# Patient Record
Sex: Female | Born: 1975
Health system: Southern US, Community
[De-identification: ages and names within clinical notes are randomized; demographics above are authoritative.]

## PROBLEM LIST (undated history)

## (undated) DIAGNOSIS — E079 Disorder of thyroid, unspecified: Secondary | ICD-10-CM

## (undated) DIAGNOSIS — R87619 Unspecified abnormal cytological findings in specimens from cervix uteri: Secondary | ICD-10-CM

## (undated) DIAGNOSIS — D649 Anemia, unspecified: Secondary | ICD-10-CM

## (undated) HISTORY — DX: Disorder of thyroid, unspecified: E07.9

## (undated) HISTORY — PX: TONSILLECTOMY: SUR1361

## (undated) HISTORY — DX: Anemia, unspecified: D64.9

## (undated) HISTORY — DX: Unspecified abnormal cytological findings in specimens from cervix uteri: R87.619

---

## 2015-05-13 ENCOUNTER — Other Ambulatory Visit: Payer: 59 | Admitting: Internal Medicine

## 2015-05-13 DIAGNOSIS — Z130 Encounter for screening for diseases of the blood and blood-forming organs and certain disorders involving the immune mechanism: Secondary | ICD-10-CM

## 2015-05-13 DIAGNOSIS — Z13220 Encounter for screening for lipoid disorders: Secondary | ICD-10-CM

## 2015-05-13 DIAGNOSIS — Z1329 Encounter for screening for other suspected endocrine disorder: Secondary | ICD-10-CM

## 2015-05-13 DIAGNOSIS — Z0000 Encounter for general adult medical examination without abnormal findings: Secondary | ICD-10-CM

## 2015-05-13 DIAGNOSIS — Z1321 Encounter for screening for nutritional disorder: Secondary | ICD-10-CM

## 2015-05-13 LAB — CBC WITH DIFFERENTIAL/PLATELET
BASOS PCT: 1 % (ref 0–1)
Basophils Absolute: 0.1 10*3/uL (ref 0.0–0.1)
EOS ABS: 0.1 10*3/uL (ref 0.0–0.7)
Eosinophils Relative: 2 % (ref 0–5)
HCT: 40.5 % (ref 36.0–46.0)
Hemoglobin: 13.4 g/dL (ref 12.0–15.0)
Lymphocytes Relative: 37 % (ref 12–46)
Lymphs Abs: 2 10*3/uL (ref 0.7–4.0)
MCH: 29.1 pg (ref 26.0–34.0)
MCHC: 33.1 g/dL (ref 30.0–36.0)
MCV: 88 fL (ref 78.0–100.0)
MONO ABS: 0.4 10*3/uL (ref 0.1–1.0)
MPV: 10.6 fL (ref 8.6–12.4)
Monocytes Relative: 8 % (ref 3–12)
NEUTROS PCT: 52 % (ref 43–77)
Neutro Abs: 2.8 10*3/uL (ref 1.7–7.7)
PLATELETS: 214 10*3/uL (ref 150–400)
RBC: 4.6 MIL/uL (ref 3.87–5.11)
RDW: 13.3 % (ref 11.5–15.5)
WBC: 5.4 10*3/uL (ref 4.0–10.5)

## 2015-05-13 LAB — LIPID PANEL
CHOL/HDL RATIO: 3 ratio (ref <=5.0)
Cholesterol: 136 mg/dL (ref 125–200)
HDL: 46 mg/dL (ref >=46)
LDL CALC: 77 mg/dL (ref <130)
Triglycerides: 63 mg/dL (ref <150)
VLDL: 13 mg/dL (ref <30)

## 2015-05-13 LAB — COMPLETE METABOLIC PANEL WITH GFR
ALT: 10 U/L (ref 6–29)
AST: 14 U/L (ref 10–30)
Albumin: 4.1 g/dL (ref 3.6–5.1)
Alkaline Phosphatase: 50 U/L (ref 33–115)
BUN: 13 mg/dL (ref 7–25)
CHLORIDE: 106 mmol/L (ref 98–110)
CO2: 26 mmol/L (ref 20–31)
Calcium: 8.9 mg/dL (ref 8.6–10.2)
Creat: 0.79 mg/dL (ref 0.50–1.10)
GFR, Est African American: >89 mL/min (ref >=60)
Glucose, Bld: 80 mg/dL (ref 65–99)
POTASSIUM: 4.1 mmol/L (ref 3.5–5.3)
SODIUM: 140 mmol/L (ref 135–146)
Total Bilirubin: 0.8 mg/dL (ref 0.2–1.2)
Total Protein: 7.2 g/dL (ref 6.1–8.1)

## 2015-05-13 LAB — TSH: TSH: 2.74 m[IU]/L

## 2015-05-14 LAB — VITAMIN D 25 HYDROXY (VIT D DEFICIENCY, FRACTURES): Vit D, 25-Hydroxy: 27 ng/mL — ABNORMAL LOW (ref 30–100)

## 2015-05-15 ENCOUNTER — Ambulatory Visit (INDEPENDENT_AMBULATORY_CARE_PROVIDER_SITE_OTHER): Payer: 59 | Admitting: Internal Medicine

## 2015-05-15 ENCOUNTER — Encounter: Payer: Self-pay | Admitting: Internal Medicine

## 2015-05-15 ENCOUNTER — Other Ambulatory Visit: Payer: Self-pay

## 2015-05-15 VITALS — BP 122/68 | HR 67 | Temp 97.6°F | Resp 20 | Ht 64.0 in | Wt 133.5 lb

## 2015-05-15 DIAGNOSIS — Z803 Family history of malignant neoplasm of breast: Secondary | ICD-10-CM

## 2015-05-15 DIAGNOSIS — E039 Hypothyroidism, unspecified: Secondary | ICD-10-CM | POA: Diagnosis not present

## 2015-05-15 DIAGNOSIS — Z0000 Encounter for general adult medical examination without abnormal findings: Secondary | ICD-10-CM | POA: Diagnosis not present

## 2015-05-15 DIAGNOSIS — Z1231 Encounter for screening mammogram for malignant neoplasm of breast: Secondary | ICD-10-CM

## 2015-05-15 DIAGNOSIS — E559 Vitamin D deficiency, unspecified: Secondary | ICD-10-CM

## 2015-05-15 DIAGNOSIS — Z1239 Encounter for other screening for malignant neoplasm of breast: Secondary | ICD-10-CM

## 2015-05-15 LAB — POCT URINALYSIS DIPSTICK
BILIRUBIN UA: NEGATIVE
GLUCOSE UA: NEGATIVE
Ketones, UA: NEGATIVE
Leukocytes, UA: NEGATIVE
Nitrite, UA: NEGATIVE
Protein, UA: NEGATIVE
RBC UA: NEGATIVE
SPEC GRAV UA: 1.01
UROBILINOGEN UA: 0.2
pH, UA: 6.5

## 2015-05-15 MED ORDER — LEVOTHYROXINE SODIUM 88 MCG PO TABS: 88.0000 ug | ORAL_TABLET | Freq: Every day | ORAL | Status: DC

## 2015-05-15 NOTE — Progress Notes (Signed)
Subjective:    Patient ID: Shelly AlimentSarah Fowler, female    DOB: 1976/01/04, 40 y.o.   MRN: [id number]  HPI First visit for this 40 year old White Female who moved to [place] from [place] in the Fall of 2016. She was referred by Frederik Peararoline Paris.  History of hypothyroidism since the age of 40. Doesn't recall having thyroiditis preceding development of hypothyroidism. Patient was a runner for many years. She ran competitively at [employer].   History of stress fractures in shins and one in femur from running. Currently not on vitamin D supplement. Vitamin D level is low and  have recommended vitamin D3 daily.  Past medical history:   No known drug allergies. No history of illnesses requiring hospitalization except for tonsillectomy in 1978.   3 pregnancies, one miscarriage  Only medication at the present time levothyroxin 0.088 mg daily.  Not sure date of last tetanus immunization but had numerous injections when she went to [place] in 2009.  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 Married with 2 daughters ages 106 and 2. Does not smoke. Social alcohol consumption. Has 1 or 2 alcoholic drinks 3-5 days a week. Husband is Librarian, academiccorporate executive with [employer] formerly [employer].  Family history: Mother with history of breast cancer and mental illness.  Father with history of hypertension and prostate cancer. One sister died from suicide with history of schizophrenia in age 41. One sister age 41 with no health issues. One sister age 41 with mood disorder. Brother age 41 with hypertension. Another brother age 830 with  depression. Brother age 41 health status unknown.  Last Pap smear 2015 was normal.  Had test for CHEK 2 mutation in [place] because of mother's history of breast cancer which was negative.  Last mammogram June 2016 in [place]     Review of Systems no issues with menstrual period. Patient scribes 15 pound weight gain since age 40. Has gained about  5 pounds in the last year. His been busy with moving and taking care of 2 small children. Not able to exercise as much as she once did.     Objective:   Physical Exam  Constitutional: She is oriented to person, place, and time. She appears well-developed and well-nourished. No distress.  HENT:  Head: Normocephalic and atraumatic.  Right Ear: External ear normal.  Left Ear: External ear normal.  Mouth/Throat: Oropharynx is clear and moist.  Eyes: Conjunctivae and EOM are normal. Pupils are equal, round, and reactive to light. Right eye exhibits no discharge. Left eye exhibits no discharge.  Neck: Neck supple. No JVD present. No thyromegaly present.  Cardiovascular: Normal rate, regular rhythm, normal heart sounds and intact distal pulses.   No murmur heard. Pulmonary/Chest: Effort normal and breath sounds normal. No respiratory distress. She has no wheezes.  Breasts normal female without masses. Order given for mammogram  Abdominal: Soft. Bowel sounds are normal. She exhibits no distension and no mass. There is no tenderness. There is no rebound and no guarding.  Genitourinary:  Pap not taken because pap done 2015. No masses on bimanual exam. Rectovaginal confirms.  Musculoskeletal: She exhibits no edema.  Lymphadenopathy:    She has no cervical adenopathy.  Neurological: She is alert and oriented to person, place, and time. She has normal reflexes. No cranial nerve deficit. Coordination normal.  Skin: Skin is warm and dry. No rash noted. She is not diaphoretic.  Psychiatric: She has a normal mood and affect. Her behavior is  normal. Judgment and thought content normal.  Vitals reviewed.         Assessment & Plan:  Normal health maintenance exam  Hypothyroidism-TSH is within normal limits on current dose of Synthroid. Refill for one year  Family history of breast cancer in mother-patient last had mammogram June 2016. Is to get mammogram in June. Order written. Suggested 3-D  mammogram.  Vitamin D deficiency-vitamin D level is 27  Plan: Refill thyroid replacement medication for one year. Usually just gets TSH checked on an annual basis.  Take 2000 units vitamin D 3 daily. Have mammogram in June. Order written. Continue same dose of thyroid replacement. Return in one year or as needed.

## 2015-05-15 NOTE — Patient Instructions (Addendum)
Take Vitamin D3 2000 units daily. Continue thyroid replacement. Have 3D mammogram.  RTC one year. It was a pleasure to see you today.

## 2015-06-03 ENCOUNTER — Ambulatory Visit
Admission: RE | Admit: 2015-06-03 | Discharge: 2015-06-03 | Disposition: A | Discharge status: Home / Self Care | Payer: 59 | Source: Ambulatory Visit

## 2015-06-03 DIAGNOSIS — Z1231 Encounter for screening mammogram for malignant neoplasm of breast: Secondary | ICD-10-CM

## 2015-06-06 ENCOUNTER — Telehealth: Payer: Self-pay

## 2015-06-06 ENCOUNTER — Other Ambulatory Visit: Payer: Self-pay | Admitting: Internal Medicine

## 2015-06-06 ENCOUNTER — Telehealth: Payer: Self-pay | Admitting: Internal Medicine

## 2015-06-06 DIAGNOSIS — Z029 Encounter for administrative examinations, unspecified: Secondary | ICD-10-CM

## 2015-06-06 DIAGNOSIS — R921 Mammographic calcification found on diagnostic imaging of breast: Secondary | ICD-10-CM

## 2015-06-06 MED ORDER — MEBENDAZOLE 100 MG PO CHEW: CHEWABLE_TABLET | ORAL | Status: DC

## 2015-06-06 NOTE — Telephone Encounter (Signed)
Husband and 41 year old has pinworms.  Wants to know if there's anything that she needs to do.  Neither has started medication.  She states that they are both to start medication later today.  She was advised to contact her doctor.  She saw them on the 41 year old.  They're not going to test.  Husband's doctor is going to provide medication.    Pharmacy:  [facility] [address]

## 2015-06-06 NOTE — Telephone Encounter (Signed)
Patient has been notified

## 2015-06-06 NOTE — Telephone Encounter (Signed)
Patient contacted office and states that the mebendazole is $735.00 for 2 tabs. She wants to know if we can call something different in. Her husband was given

## 2015-06-06 NOTE — Telephone Encounter (Signed)
Patient was notified and states that she has not asked the pharmacist yet how much the medication would be. She states that she has been to 6 different pharmacies with no luck finding anything over the counter. She was advise to contact us again by 5:00 if she wants to prescription and that she should talk to her pharmacist as well to help her find something over the counter. She called back and states that she found the reese's and will take that.

## 2015-06-06 NOTE — Telephone Encounter (Signed)
Albendazole 400 mg onece and repeat in 2 weeks for total of 2 tabs is another option. Please have her check this cost with her pharmacy before we prescribe. The other option is Reese's pinworm medication OTC

## 2015-06-06 NOTE — Telephone Encounter (Signed)
Call in Mebendazole 100 mg once and repeat in 2 weeks for  total of 2 tablets. Wash linens in hot water.

## 2015-06-18 ENCOUNTER — Ambulatory Visit
Admission: RE | Admit: 2015-06-18 | Discharge: 2015-06-18 | Disposition: A | Discharge status: Home / Self Care | Payer: 59 | Source: Ambulatory Visit | Attending: Internal Medicine | Admitting: Internal Medicine

## 2015-06-18 DIAGNOSIS — R921 Mammographic calcification found on diagnostic imaging of breast: Secondary | ICD-10-CM

## 2015-11-28 ENCOUNTER — Other Ambulatory Visit: Payer: Self-pay | Admitting: Internal Medicine

## 2016-03-03 ENCOUNTER — Other Ambulatory Visit: Payer: Self-pay | Admitting: Internal Medicine

## 2016-03-03 NOTE — Telephone Encounter (Signed)
Call and book PE after March 30 OK to refill until then after booking.

## 2016-03-08 ENCOUNTER — Ambulatory Visit (INDEPENDENT_AMBULATORY_CARE_PROVIDER_SITE_OTHER): Payer: 59 | Admitting: Internal Medicine

## 2016-03-08 ENCOUNTER — Encounter: Payer: Self-pay | Admitting: Internal Medicine

## 2016-03-08 VITALS — BP 110/80 | HR 55 | Wt 128.0 lb

## 2016-03-08 DIAGNOSIS — J029 Acute pharyngitis, unspecified: Secondary | ICD-10-CM | POA: Diagnosis not present

## 2016-03-08 DIAGNOSIS — J069 Acute upper respiratory infection, unspecified: Secondary | ICD-10-CM | POA: Diagnosis not present

## 2016-03-08 LAB — POCT RAPID STREP A (OFFICE): RAPID STREP A SCREEN: NEGATIVE

## 2016-03-08 MED ORDER — AMOXICILLIN 500 MG PO CAPS: 500.0000 mg | ORAL_CAPSULE | Freq: Three times a day (TID) | ORAL | 0 refills | Status: DC

## 2016-03-08 NOTE — Patient Instructions (Signed)
Rapid strep screen is negative. Take amoxicillin 500 mg 3 times a day for 10 days. May take DayQuil for nasal congestion and Delsym for cough.

## 2016-03-08 NOTE — Progress Notes (Signed)
   Subjective:    Patient ID: Shelly AlimentSarah Fowler, female    DOB: 1975/02/26, 41 y.o.   MRN: [id number]  HPI 41 year old Female who has a young daughter in preschool. She's been getting sick more often since child has been in daycare. Has come down with a respiratory infection. Had onset yesterday of scratchy throat. Has had some cough and some discolored nasal drainage. No fever or shaking chills. Does not feel like she has the flu. Husband has been sick as well. See above    Review of Systems     Objective:   Physical Exam Skin warm and dry. Nodes none. TMs are clear. Pharynx is slightly injected without exudate. Rapid strep screen is negative. Neck is supple. Chest clear to auscultation.       Assessment & Plan:  Acute URI  Acute pharyngitis  Plan: Amoxicillin 500 mg 3 times daily for 10 days. May take over-the-counter Delsym for cough. May take  DayQuil for nasal congestion.

## 2016-05-21 ENCOUNTER — Other Ambulatory Visit: Payer: Self-pay | Admitting: Internal Medicine

## 2016-05-24 ENCOUNTER — Other Ambulatory Visit: Payer: Self-pay | Admitting: Internal Medicine

## 2016-05-24 ENCOUNTER — Other Ambulatory Visit: Payer: 59 | Admitting: Internal Medicine

## 2016-05-24 DIAGNOSIS — E039 Hypothyroidism, unspecified: Secondary | ICD-10-CM

## 2016-05-24 DIAGNOSIS — E559 Vitamin D deficiency, unspecified: Secondary | ICD-10-CM

## 2016-05-24 DIAGNOSIS — Z13220 Encounter for screening for lipoid disorders: Secondary | ICD-10-CM

## 2016-05-24 DIAGNOSIS — Z0000 Encounter for general adult medical examination without abnormal findings: Secondary | ICD-10-CM

## 2016-05-24 LAB — COMPREHENSIVE METABOLIC PANEL
ALT: 11 U/L (ref 6–29)
AST: 19 U/L (ref 10–30)
Albumin: 4.3 g/dL (ref 3.6–5.1)
Alkaline Phosphatase: 53 U/L (ref 33–115)
BILIRUBIN TOTAL: 0.9 mg/dL (ref 0.2–1.2)
BUN: 20 mg/dL (ref 7–25)
CHLORIDE: 105 mmol/L (ref 98–110)
CO2: 28 mmol/L (ref 20–31)
CREATININE: 0.91 mg/dL (ref 0.50–1.10)
Calcium: 8.8 mg/dL (ref 8.6–10.2)
Glucose, Bld: 86 mg/dL (ref 65–99)
Potassium: 4.3 mmol/L (ref 3.5–5.3)
SODIUM: 139 mmol/L (ref 135–146)
TOTAL PROTEIN: 6.8 g/dL (ref 6.1–8.1)

## 2016-05-24 LAB — CBC WITH DIFFERENTIAL/PLATELET
BASOS ABS: 46 {cells}/uL (ref 0–200)
Basophils Relative: 1 %
Eosinophils Absolute: 138 cells/uL (ref 15–500)
Eosinophils Relative: 3 %
HCT: 40.3 % (ref 35.0–45.0)
Hemoglobin: 13.3 g/dL (ref 11.7–15.5)
LYMPHS ABS: 1656 {cells}/uL (ref 850–3900)
Lymphocytes Relative: 36 %
MCH: 29.4 pg (ref 27.0–33.0)
MCHC: 33 g/dL (ref 32.0–36.0)
MCV: 89.2 fL (ref 80.0–100.0)
MONOS PCT: 7 %
MPV: 10.2 fL (ref 7.5–12.5)
Monocytes Absolute: 322 cells/uL (ref 200–950)
NEUTROS ABS: 2438 {cells}/uL (ref 1500–7800)
Neutrophils Relative %: 53 %
PLATELETS: 192 10*3/uL (ref 140–400)
RBC: 4.52 MIL/uL (ref 3.80–5.10)
RDW: 13.1 % (ref 11.0–15.0)
WBC: 4.6 10*3/uL (ref 3.8–10.8)

## 2016-05-24 LAB — LIPID PANEL
CHOLESTEROL: 136 mg/dL (ref <200)
HDL: 54 mg/dL (ref >50)
LDL CALC: 73 mg/dL (ref <100)
Total CHOL/HDL Ratio: 2.5 Ratio (ref <5.0)
Triglycerides: 44 mg/dL (ref <150)
VLDL: 9 mg/dL (ref <30)

## 2016-05-24 LAB — TSH: TSH: 2.99 mIU/L

## 2016-05-25 ENCOUNTER — Other Ambulatory Visit: Payer: Self-pay | Admitting: Internal Medicine

## 2016-05-25 ENCOUNTER — Encounter: Payer: Self-pay | Admitting: Internal Medicine

## 2016-05-25 ENCOUNTER — Ambulatory Visit (INDEPENDENT_AMBULATORY_CARE_PROVIDER_SITE_OTHER): Payer: 59 | Admitting: Internal Medicine

## 2016-05-25 VITALS — BP 102/78 | HR 64 | Ht 63.0 in | Wt 128.0 lb

## 2016-05-25 DIAGNOSIS — Z803 Family history of malignant neoplasm of breast: Secondary | ICD-10-CM

## 2016-05-25 DIAGNOSIS — Z0000 Encounter for general adult medical examination without abnormal findings: Secondary | ICD-10-CM

## 2016-05-25 DIAGNOSIS — N921 Excessive and frequent menstruation with irregular cycle: Secondary | ICD-10-CM

## 2016-05-25 DIAGNOSIS — E559 Vitamin D deficiency, unspecified: Secondary | ICD-10-CM | POA: Diagnosis not present

## 2016-05-25 DIAGNOSIS — E039 Hypothyroidism, unspecified: Secondary | ICD-10-CM

## 2016-05-25 DIAGNOSIS — Z1231 Encounter for screening mammogram for malignant neoplasm of breast: Secondary | ICD-10-CM

## 2016-05-25 LAB — TSH: TSH: 3.01 m[IU]/L

## 2016-05-25 LAB — POCT URINALYSIS DIPSTICK
Bilirubin, UA: NEGATIVE
GLUCOSE UA: NEGATIVE
Ketones, UA: NEGATIVE
Leukocytes, UA: NEGATIVE
Nitrite, UA: NEGATIVE
PH UA: 7 (ref 5.0–8.0)
PROTEIN UA: NEGATIVE
Spec Grav, UA: 1.01 (ref 1.030–1.035)
UROBILINOGEN UA: 0.2 (ref ?–2.0)

## 2016-05-25 LAB — VITAMIN D 25 HYDROXY (VIT D DEFICIENCY, FRACTURES): VIT D 25 HYDROXY: 25 ng/mL — AB (ref 30–100)

## 2016-05-25 MED ORDER — LEVOTHYROXINE SODIUM 88 MCG PO TABS: 88.0000 ug | ORAL_TABLET | Freq: Every day | ORAL | 1 refills | Status: DC

## 2016-05-25 NOTE — Progress Notes (Signed)
Subjective:    Patient ID: Shelly Fowler, female    DOB: December 02, 1975, 41 y.o.   MRN: [id number]  HPI 41 year old female for health maintenance exam and evaluation of medical issues.Initially presented here for the first time in March 2017. She was referred by Frederik Pear.  History of hypothyroidism since the age of 61. Did not recall having thyroiditis preceding development of hypothyroidism. Patient was a runner for many years. She ran competitively at [employer].  History of stress fractures and she ends and one in femur from running.  Past medical history: No known drug allergies. No history of illnesses requiring hospitalization except for tonsillectomy in 1978. 3 pregnancies and one miscarriage.  She is on thyroid replacement levothyroxine 0.088 mg daily.  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 She is married with 2 daughters ages 18 and 60. Does not smoke. Social alcohol consumption. She has one or 2 alcoholic drinks 3-5 days a week. Husband is a Librarian, academic with [employer] formerly known as [employer].  Family history: Mother with history of breast cancer and mental illness. Father with history of hypertension and prostate cancer. One sister died from suicide with history of schizophrenia at age 52. One sister in her mid 45s with no health issues. One sister age 7 with mood disorder. Brother age 50 with hypertension. Another brother in his early 62s with history of depression. One brother age 11 his health status is unknown.  Patient had test for CHEK 2 mutation in [place] because of mother's history of breast cancer which was negative.      Review of Systems  Constitutional: Negative.   All other systems reviewed and are negative.      Objective:   Physical Exam  Constitutional: She is oriented to person, place, and time. She appears well-developed and well-nourished. No distress.  HENT:  Head: Normocephalic and atraumatic.    Right Ear: External ear normal.  Left Ear: External ear normal.  Mouth/Throat: Oropharynx is clear and moist. No oropharyngeal exudate.  Eyes: Conjunctivae and EOM are normal. Pupils are equal, round, and reactive to light. Left eye exhibits no discharge. No scleral icterus.  Neck: Neck supple. No JVD present. No thyromegaly present.  Cardiovascular: Normal rate, regular rhythm, normal heart sounds and intact distal pulses.   No murmur heard. Pulmonary/Chest: Effort normal and breath sounds normal. No respiratory distress. She has no wheezes. She has no rales.  Breasts normal female  Abdominal: Soft. Bowel sounds are normal. She exhibits no distension and no mass. There is no tenderness. There is no rebound and no guarding.  Genitourinary:  Genitourinary Comments: Pap deferred. Last Pap was in [place] in [place]. Bimanual normal.  Musculoskeletal: Normal range of motion. She exhibits no edema or deformity.  Lymphadenopathy:    She has no cervical adenopathy.  Neurological: She is alert and oriented to person, place, and time. She has normal reflexes. She displays normal reflexes. No cranial nerve deficit. Coordination normal.  Skin: Skin is warm and dry. No rash noted. She is not diaphoretic.  Psychiatric: She has a normal mood and affect. Her behavior is normal. Thought content normal.  Vitals reviewed.         Assessment & Plan:  Normal health maintenance exam  Vitamin D deficiency-recommend 2000 units vitamin D 3 daily  Hypothyroidism-TSH is within normal limits on current dose of thyroid replacement. Refill for one year.  Family history of breast cancer in mother. Suggest annual  mammogram.  Plan: See above and return one year or as needed.

## 2016-06-09 NOTE — Patient Instructions (Addendum)
FSH checked because of history of irregular menses. Take 2000 units vitamin D 3 daily. Have annual mammogram and return in one year. Continue same dose of thyroid replacement. Patient return on April 26 for Pap smear and TDap Vaccine.

## 2016-06-10 ENCOUNTER — Ambulatory Visit (INDEPENDENT_AMBULATORY_CARE_PROVIDER_SITE_OTHER): Payer: 59 | Admitting: Internal Medicine

## 2016-06-10 ENCOUNTER — Other Ambulatory Visit (HOSPITAL_COMMUNITY)
Admission: RE | Admit: 2016-06-10 | Discharge: 2016-06-10 | Disposition: A | Discharge status: Home / Self Care | Payer: 59 | Source: Ambulatory Visit | Attending: Internal Medicine | Admitting: Internal Medicine

## 2016-06-10 ENCOUNTER — Encounter: Payer: Self-pay | Admitting: Internal Medicine

## 2016-06-10 VITALS — BP 110/82 | HR 49 | Temp 98.6°F | Wt 128.0 lb

## 2016-06-10 DIAGNOSIS — N921 Excessive and frequent menstruation with irregular cycle: Secondary | ICD-10-CM

## 2016-06-10 DIAGNOSIS — Z23 Encounter for immunization: Secondary | ICD-10-CM

## 2016-06-10 DIAGNOSIS — Z01419 Encounter for gynecological examination (general) (routine) without abnormal findings: Secondary | ICD-10-CM | POA: Insufficient documentation

## 2016-06-10 DIAGNOSIS — Z124 Encounter for screening for malignant neoplasm of cervix: Secondary | ICD-10-CM | POA: Diagnosis not present

## 2016-06-10 NOTE — Patient Instructions (Signed)
Pap smear completed. FSH drawn. Tetanus immunization update given.

## 2016-06-10 NOTE — Progress Notes (Signed)
   Subjective:    Patient ID: Shelly Fowler, female    DOB: 02-Feb-1976, 41 y.o.   MRN: [id number]  HPI She is here today to have Tdap immunization, Pap smear and pelvic exam which was not able be done at last visit and have FSH drawn because she's been having irregular menses. She's been having menstrual periods about every other month for the last several months.    Review of Systems     Objective:   Physical Exam : Normal female external genitalia Pap taken. Cervical and endocervical swabs obtained. No masses on bimanual exam. Rectovaginal confirms.       Assessment & Plan:  Need for Tdap immunization  Metrorrhagia  FSH drawn and pending. Pap smear completed.

## 2016-06-11 LAB — FOLLICLE STIMULATING HORMONE: FSH: 24.7 m[IU]/mL

## 2016-06-15 LAB — CYTOLOGY - PAP
Diagnosis: NEGATIVE
HPV: NOT DETECTED

## 2016-06-21 ENCOUNTER — Ambulatory Visit (INDEPENDENT_AMBULATORY_CARE_PROVIDER_SITE_OTHER): Payer: 59 | Admitting: Obstetrics & Gynecology

## 2016-06-21 ENCOUNTER — Encounter: Payer: Self-pay | Admitting: Obstetrics & Gynecology

## 2016-06-21 VITALS — BP 108/70 | HR 56 | Resp 14 | Ht 64.0 in | Wt 128.0 lb

## 2016-06-21 DIAGNOSIS — N839 Noninflammatory disorder of ovary, fallopian tube and broad ligament, unspecified: Secondary | ICD-10-CM | POA: Diagnosis not present

## 2016-06-21 DIAGNOSIS — E349 Endocrine disorder, unspecified: Secondary | ICD-10-CM

## 2016-06-21 DIAGNOSIS — E559 Vitamin D deficiency, unspecified: Secondary | ICD-10-CM

## 2016-06-21 DIAGNOSIS — N838 Other noninflammatory disorders of ovary, fallopian tube and broad ligament: Secondary | ICD-10-CM

## 2016-06-21 MED ORDER — VITAMIN D (ERGOCALCIFEROL) 1.25 MG (50000 UNIT) PO CAPS: 50000.0000 [IU] | ORAL_CAPSULE | ORAL | 1 refills | Status: DC

## 2016-06-21 NOTE — Progress Notes (Signed)
Patient scheduled while in office. Sp[facility] with Cher[facility] at [facility], patient scheduled for BMD on 06/22/16 at 8:30am. Patient scheduled for 3 month Vit D lab on 09/27/16 at 9:45am. Patient is agreeable to dates and times.

## 2016-06-21 NOTE — Progress Notes (Addendum)
41 y.o. [id number] MarriedCaucasianF here for new patient appt.  She was referred from Dr. Lenord FellersBaxley for perimenopausal symptoms.  FSH was 24.  She has been having less regular cycles for about the year.  She can skip for several months but has not gone more than three months without a cycle.  Flow is not heavy and lasts 4-5 days.    Mother has hx of breast cancer.  She did have an positive genetic testing.  Pt is not completely sure the name of the gene, however, pt did proceed with genetic testing and this was negative.  Mother now has stage IV breast cancer.  Pt has experienced several stress fractures in her legs due to distant running.  These was when she was younger.  Pt reports she only recently started exercising and running more regularly.  States she didn't exercise that much during the years of her pregnancies and, again, until recently.  Due to her reading on the Internet, she does have some concern about bone health.    Also wants to talk about possible treatments, alternative therapies and other risks she should be aware of at this time.  Patient's last menstrual period was 05/25/2016.          Sexually active: no The current method of family planning is abstinence.    Exercising: Yes.    running Smoker:  no  Health Maintenance: Pap:  06/10/16 History of abnormal Pap:  Yes, 2008 but follow up was negative MMG:  06/18/15, has MMG scheduled tomorrow Colonoscopy:  n/a BMD:   Never done TDaP:  06/10/16 Screening Labs: with Dr. Lenord FellersBaxley   reports that she has never smoked. She has never used smokeless tobacco. She reports that she drinks about 3.6 oz of alcohol per week . She reports that she does not use drugs.  Past Medical History:  Diagnosis Date  . Abnormal Pap smear of cervix ~2008   f/u normal   . Anemia   . Thyroid disease     Past Surgical History:  Procedure Laterality Date  . TONSILLECTOMY     age 28    Current Outpatient Prescriptions  Medication Sig Dispense Refill   . cholecalciferol (VITAMIN D) 1000 units tablet Take 1,000 Units by mouth daily.    Marland Kitchen. levothyroxine (SYNTHROID, LEVOTHROID) 88 MCG tablet Take 1 tablet (88 mcg total) by mouth daily. 90 tablet 1   No current facility-administered medications for this visit.     Family History  Problem Relation Age of Onset  . Breast cancer Mother   . Diabetes Mother   . Cancer Father     Prostate  . Diabetes Paternal Grandmother     ROS:  Pertinent items are noted in HPI.  Otherwise, a comprehensive ROS was negative.  Exam:   BP 108/70 (BP Location: Right Arm, Patient Position: Sitting, Cuff Size: Normal)   Pulse (!) 56   Resp 14   Ht 5\' 4"  (1.626 m)   Wt 128 lb (58.1 kg)   LMP 05/25/2016 Comment: patient is currently menstruating   BMI 21.97 kg/m     Height: 5\' 4"  (162.6 cm)  Ht Readings from Last 3 Encounters:  06/21/16 5\' 4"  (1.626 m)  05/25/16 5\' 3"  (1.6 m)  05/15/15 5\' 4"  (1.626 m)   Physical Exam  Constitutional: She is oriented to person, place, and time. She appears well-developed and well-nourished.  Neurological: She is alert and oriented to person, place, and time.  Psychiatric: She has a normal mood and  affect.  No other physical exam performed  Discussion:  FSH and timing of menopause reviewed.  FSH fluctuations as well as confirmation with [facility] discussed.  Pt does not feel she needs this.  Pt does not meet criteria for premature ovarian failure so extensive testing for early menopause is not needed.  She has had recent TSH testing.  Menopausal risks for cardiovascular disease and bone health reviewed.  Treatment with OCPs or HRT discussed.  With family hx, she is not interested in this and I agree.  Feel baseline BMD would be useful in determining whether Evista might be helpful for decreasing breast cancer risk and helping with bone health.  Pt would be open to this.  Optimal calcium and Vit D discussed.  Recent Vit D has been obtained.  Pt voices clear understanding  A:   Perimenopausal with FSH of 46 Mother with Stage 4 breast cancer and mother with positive genetic testing. Pt has undergone testing and is negative per her hx.  (I do not have records.) Vit D deficiency  P:   Mammogram is scheduled Will add BMD to this as a baseline pap smear is UTD.  Will see if can add HR HPV testing to this. Calcium, Vit D and exercise discussed with pt Recheck Vit D 3 months.  Will also check prolactin at that time as well. For now, no additional treatment is recommended Pt is welcome to return at any time in the future to discuss additional testing or concerns.  ~40 minutes spent with patient >50% of time was in face to face discussion of above.

## 2016-06-22 ENCOUNTER — Ambulatory Visit
Admission: RE | Admit: 2016-06-22 | Discharge: 2016-06-22 | Disposition: A | Discharge status: Home / Self Care | Payer: 59 | Source: Ambulatory Visit | Attending: Internal Medicine | Admitting: Internal Medicine

## 2016-06-22 ENCOUNTER — Ambulatory Visit: Payer: 59

## 2016-06-22 ENCOUNTER — Ambulatory Visit
Admission: RE | Admit: 2016-06-22 | Discharge: 2016-06-22 | Disposition: A | Discharge status: Home / Self Care | Payer: 59 | Source: Ambulatory Visit | Attending: Obstetrics & Gynecology | Admitting: Obstetrics & Gynecology

## 2016-06-22 ENCOUNTER — Telehealth: Payer: Self-pay | Admitting: Obstetrics & Gynecology

## 2016-06-22 DIAGNOSIS — Z1231 Encounter for screening mammogram for malignant neoplasm of breast: Secondary | ICD-10-CM | POA: Diagnosis not present

## 2016-06-22 DIAGNOSIS — E28319 Asymptomatic premature menopause: Secondary | ICD-10-CM

## 2016-06-22 DIAGNOSIS — N951 Menopausal and female climacteric states: Secondary | ICD-10-CM

## 2016-06-22 DIAGNOSIS — M8588 Other specified disorders of bone density and structure, other site: Secondary | ICD-10-CM | POA: Diagnosis not present

## 2016-06-22 NOTE — Telephone Encounter (Signed)
[facility] called and requested a new order for a bone density be sent to them for "peri-menopause." The existing order says "menopause" and the patient is not in menopause. The patient has already been seen and had the scan but the order needs corrected.  Please fax order to: [phone], attn: Jearld PiesFelia

## 2016-06-22 NOTE — Telephone Encounter (Signed)
New order for BMD placed via EPIC with diagnosis of perimenopausal.  Dr.Miller, please sign order in EPIC.

## 2016-06-23 ENCOUNTER — Encounter: Payer: Self-pay | Admitting: Obstetrics & Gynecology

## 2016-06-23 ENCOUNTER — Other Ambulatory Visit: Payer: Self-pay | Admitting: Obstetrics & Gynecology

## 2016-06-23 DIAGNOSIS — N915 Oligomenorrhea, unspecified: Secondary | ICD-10-CM

## 2016-06-23 DIAGNOSIS — E559 Vitamin D deficiency, unspecified: Secondary | ICD-10-CM

## 2016-06-23 NOTE — Addendum Note (Signed)
Addended by: Jerene BearsMILLER, Savaya Hakes S on: 06/23/2016 07:02 AM   Modules accepted: Level of Service

## 2016-07-07 ENCOUNTER — Telehealth: Payer: Self-pay | Admitting: *Deleted

## 2016-07-07 NOTE — Telephone Encounter (Signed)
Patient is returning a call to Jill. °

## 2016-07-07 NOTE — Telephone Encounter (Signed)
-----   Message from Jerene BearsMary S Miller, MD sent at 07/07/2016  7:51 AM EDT ----- Please let pt know that her BMD showed normal hip findings but there is significant osteopenia in her spine.  I think this could be treated or monitored.  As we have no other tests for comparison, my recommendation would be to optimize calcium and vit d (1200mg  and 800 IU) and repeat this in two years.  If she has a lot of questions, please have her return for consultation.  If she is ok to wait, I will put her in my own reminders and we will contact her in two years to schedule the follow up.    Also, let her know her HR HPV test was added to the pap smear done with Dr. Lenord Fellersbaxley and it was negative.

## 2016-07-07 NOTE — Telephone Encounter (Signed)
Left message to call Jaymee Tilson at [phone].  

## 2016-07-07 NOTE — Telephone Encounter (Signed)
Spoke with patient, advised of results and recommendations as seen below per Dr. Hyacinth MeekerMiller. Patient has additional questions/concerns about the "significant osteopenia" and waiting 2 years to recheck.  Recommended OV for further discussion with Dr. Hyacinth MeekerMiller. Patient scheduled for 07/15/16 at 8:30am, patient verbalizes understanding and is agreeable.   Routing to provider for final review. Patient is agreeable to disposition. Will close encounter.

## 2016-07-15 ENCOUNTER — Ambulatory Visit (INDEPENDENT_AMBULATORY_CARE_PROVIDER_SITE_OTHER): Payer: 59 | Admitting: Obstetrics & Gynecology

## 2016-07-15 ENCOUNTER — Encounter: Payer: Self-pay | Admitting: Obstetrics & Gynecology

## 2016-07-15 VITALS — BP 110/80 | HR 64 | Resp 14 | Ht 64.0 in | Wt 129.0 lb

## 2016-07-15 DIAGNOSIS — E559 Vitamin D deficiency, unspecified: Secondary | ICD-10-CM

## 2016-07-15 DIAGNOSIS — M8588 Other specified disorders of bone density and structure, other site: Secondary | ICD-10-CM | POA: Diagnosis not present

## 2016-07-15 NOTE — Progress Notes (Signed)
GYNECOLOGY  VISIT   HPI: 41 y.o. [id number] Married Caucasian female here to discuss recent BMD showing osteopenia with t score of -2.4 in spine.  At this was her first BMD, she has no hx of pathologic fracture, FRAX score was not high risk, and she does not have a first degree family member with fragility fracture hx, I felt it appropriate to consider repeating BMD in two years, optimized calcium and Vit D in take, increased weight bearing exercise and see where we were in two years.    Pt requested appt to review all of this.  Recent BMD was reviewed with pt.  Then FRAX scoring calculations shared with pt as well as information regarding this calculation.  Lastly my reasoning for waiting for two years and repeating BMD discussed.  Finally treatment options including Bisphosphonate and Evista discussed.  Dosing, route of administration, side effects and risks reviewed. Pt and I also discussed Calcitonin, Prolia, and Forteo but she is aware the first does not work well and she does not qualify, at this point for [place] or Forteo.  We did not really discuss hormonal therapy as her mother as stage IV breast cancer and she has no interest in taking hormones of any sort.  All questions answered.  She had many.  GYNECOLOGIC HISTORY: Patient's last menstrual period was 07/02/2016. Contraception: abstinance Menopausal hormone therapy: none  Patient Active Problem List   Diagnosis Date Noted  . Hypothyroidism 05/15/2015  . Vitamin D deficiency 05/15/2015  . Family history of breast cancer in mother 05/15/2015    Past Medical History:  Diagnosis Date  . Abnormal Pap smear of cervix ~2008   f/u normal   . Anemia   . Thyroid disease     Past Surgical History:  Procedure Laterality Date  . TONSILLECTOMY     age 29    MEDS:  Reviewed in EPIC and UTD  ALLERGIES: Patient has no known allergies.  Family History  Problem Relation Age of Onset  . Breast cancer Mother        has abnormal genetic  testing  . Diabetes Mother   . Cancer Father        Prostate  . Diabetes Paternal Grandmother     SH:  Married, non smoker  Review of Systems  All other systems reviewed and are negative.   PHYSICAL EXAMINATION:    BP 110/80 (BP Location: Right Arm, Patient Position: Sitting, Cuff Size: Normal)   Pulse 64   Resp 14   Ht 5\' 4"  (1.626 m)   Wt 129 lb (58.5 kg)   LMP 07/02/2016   BMI 22.14 kg/m     Physical Exam  Constitutional: She is oriented to person, place, and time. She appears well-developed and well-nourished.  Neurological: She is alert and oriented to person, place, and time.  Psychiatric: She has a normal mood and affect.   Assessment: Osteopenia with T score -2.4 in lumbar spine Low Vit D  Plan: Continue Vit D supplementation.  Has follow up Vit D and prolactin levels scheduled. Pt will consider what we discussed.  My recommendation would be to either--continue maximizing calcium and Vit D as well as increasing weight bearing exercise and repeating the BMD in two years vs starting on Evista and repeating the BMD in two years.  She wants to do some research on her own and will call with any decisions she makes.   ~40 minutes spent with patient in face to face discussion of above.

## 2016-09-27 ENCOUNTER — Other Ambulatory Visit: Payer: Self-pay

## 2016-10-04 ENCOUNTER — Other Ambulatory Visit: Payer: Self-pay

## 2016-10-06 ENCOUNTER — Other Ambulatory Visit: Payer: Self-pay | Admitting: *Deleted

## 2016-10-06 ENCOUNTER — Other Ambulatory Visit (INDEPENDENT_AMBULATORY_CARE_PROVIDER_SITE_OTHER): Payer: 59

## 2016-10-06 DIAGNOSIS — N915 Oligomenorrhea, unspecified: Secondary | ICD-10-CM | POA: Diagnosis not present

## 2016-10-06 DIAGNOSIS — E559 Vitamin D deficiency, unspecified: Secondary | ICD-10-CM

## 2016-10-06 DIAGNOSIS — N921 Excessive and frequent menstruation with irregular cycle: Secondary | ICD-10-CM

## 2016-10-07 LAB — PROLACTIN: Prolactin: 14.4 ng/mL (ref 4.8–23.3)

## 2016-10-07 LAB — VITAMIN D 25 HYDROXY (VIT D DEFICIENCY, FRACTURES): Vit D, 25-Hydroxy: 67.4 ng/mL (ref 30.0–100.0)

## 2016-11-30 DIAGNOSIS — Z23 Encounter for immunization: Secondary | ICD-10-CM | POA: Diagnosis not present

## 2017-01-01 DIAGNOSIS — J029 Acute pharyngitis, unspecified: Secondary | ICD-10-CM | POA: Diagnosis not present

## 2017-01-06 DIAGNOSIS — Z760 Encounter for issue of repeat prescription: Secondary | ICD-10-CM | POA: Diagnosis not present

## 2017-01-06 DIAGNOSIS — J029 Acute pharyngitis, unspecified: Secondary | ICD-10-CM | POA: Diagnosis not present

## 2017-01-20 ENCOUNTER — Other Ambulatory Visit: Payer: Self-pay | Admitting: Obstetrics & Gynecology

## 2017-01-20 DIAGNOSIS — E559 Vitamin D deficiency, unspecified: Secondary | ICD-10-CM

## 2017-06-02 ENCOUNTER — Other Ambulatory Visit: Payer: Self-pay | Admitting: Internal Medicine

## 2017-06-24 ENCOUNTER — Ambulatory Visit
Admission: RE | Admit: 2017-06-24 | Discharge: 2017-06-24 | Disposition: A | Discharge status: Home / Self Care | Payer: 59 | Source: Ambulatory Visit | Attending: Internal Medicine | Admitting: Internal Medicine

## 2017-06-24 DIAGNOSIS — Z0000 Encounter for general adult medical examination without abnormal findings: Secondary | ICD-10-CM

## 2017-06-24 DIAGNOSIS — Z1231 Encounter for screening mammogram for malignant neoplasm of breast: Secondary | ICD-10-CM | POA: Diagnosis not present

## 2017-06-27 ENCOUNTER — Other Ambulatory Visit: Payer: Self-pay

## 2017-06-27 DIAGNOSIS — Z0000 Encounter for general adult medical examination without abnormal findings: Secondary | ICD-10-CM

## 2017-06-27 DIAGNOSIS — Z1329 Encounter for screening for other suspected endocrine disorder: Secondary | ICD-10-CM

## 2017-06-27 DIAGNOSIS — Z13220 Encounter for screening for lipoid disorders: Secondary | ICD-10-CM

## 2017-07-12 ENCOUNTER — Other Ambulatory Visit: Payer: 59 | Admitting: Internal Medicine

## 2017-07-12 DIAGNOSIS — Z13220 Encounter for screening for lipoid disorders: Secondary | ICD-10-CM

## 2017-07-12 DIAGNOSIS — Z1329 Encounter for screening for other suspected endocrine disorder: Secondary | ICD-10-CM | POA: Diagnosis not present

## 2017-07-12 DIAGNOSIS — Z0000 Encounter for general adult medical examination without abnormal findings: Secondary | ICD-10-CM | POA: Diagnosis not present

## 2017-07-12 LAB — COMPLETE METABOLIC PANEL WITH GFR
AG Ratio: 1.9 (calc) (ref 1.0–2.5)
ALBUMIN MSPROF: 4.5 g/dL (ref 3.6–5.1)
ALKALINE PHOSPHATASE (APISO): 57 U/L (ref 33–115)
ALT: 35 U/L — ABNORMAL HIGH (ref 6–29)
AST: 29 U/L (ref 10–30)
BUN: 12 mg/dL (ref 7–25)
CALCIUM: 9.1 mg/dL (ref 8.6–10.2)
CO2: 29 mmol/L (ref 20–32)
Chloride: 106 mmol/L (ref 98–110)
Creat: 0.82 mg/dL (ref 0.50–1.10)
GFR, EST NON AFRICAN AMERICAN: 88 mL/min/{1.73_m2} (ref >=60)
GFR, Est African American: 102 mL/min/{1.73_m2} (ref >=60)
GLOBULIN: 2.4 g/dL (ref 1.9–3.7)
GLUCOSE: 84 mg/dL (ref 65–99)
Potassium: 4.7 mmol/L (ref 3.5–5.3)
Sodium: 139 mmol/L (ref 135–146)
Total Bilirubin: 0.7 mg/dL (ref 0.2–1.2)
Total Protein: 6.9 g/dL (ref 6.1–8.1)

## 2017-07-12 LAB — CBC WITH DIFFERENTIAL/PLATELET
BASOS PCT: 1.4 %
Basophils Absolute: 62 cells/uL (ref 0–200)
EOS ABS: 163 {cells}/uL (ref 15–500)
Eosinophils Relative: 3.7 %
HEMATOCRIT: 38.3 % (ref 35.0–45.0)
HEMOGLOBIN: 13.1 g/dL (ref 11.7–15.5)
LYMPHS ABS: 1764 {cells}/uL (ref 850–3900)
MCH: 29.8 pg (ref 27.0–33.0)
MCHC: 34.2 g/dL (ref 32.0–36.0)
MCV: 87.2 fL (ref 80.0–100.0)
MPV: 10.8 fL (ref 7.5–12.5)
Monocytes Relative: 9.4 %
NEUTROS ABS: 1998 {cells}/uL (ref 1500–7800)
Neutrophils Relative %: 45.4 %
PLATELETS: 173 10*3/uL (ref 140–400)
RBC: 4.39 10*6/uL (ref 3.80–5.10)
RDW: 12.4 % (ref 11.0–15.0)
Total Lymphocyte: 40.1 %
WBC: 4.4 10*3/uL (ref 3.8–10.8)
WBCMIX: 414 {cells}/uL (ref 200–950)

## 2017-07-12 LAB — LIPID PANEL
CHOL/HDL RATIO: 2.7 (calc) (ref <5.0)
CHOLESTEROL: 155 mg/dL (ref <200)
HDL: 58 mg/dL (ref >50)
LDL CHOLESTEROL (CALC): 81 mg/dL
Non-HDL Cholesterol (Calc): 97 mg/dL (calc) (ref <130)
Triglycerides: 81 mg/dL (ref <150)

## 2017-07-12 LAB — TSH: TSH: 3.62 m[IU]/L

## 2017-07-14 ENCOUNTER — Encounter: Payer: Self-pay | Admitting: Internal Medicine

## 2017-07-14 ENCOUNTER — Ambulatory Visit (INDEPENDENT_AMBULATORY_CARE_PROVIDER_SITE_OTHER): Payer: 59 | Admitting: Internal Medicine

## 2017-07-14 ENCOUNTER — Other Ambulatory Visit: Payer: Self-pay | Admitting: Internal Medicine

## 2017-07-14 VITALS — BP 102/80 | HR 61 | Ht 63.5 in | Wt 133.0 lb

## 2017-07-14 DIAGNOSIS — E559 Vitamin D deficiency, unspecified: Secondary | ICD-10-CM | POA: Diagnosis not present

## 2017-07-14 DIAGNOSIS — R748 Abnormal levels of other serum enzymes: Secondary | ICD-10-CM

## 2017-07-14 DIAGNOSIS — R740 Nonspecific elevation of levels of transaminase and lactic acid dehydrogenase [LDH]: Secondary | ICD-10-CM

## 2017-07-14 DIAGNOSIS — Z0000 Encounter for general adult medical examination without abnormal findings: Secondary | ICD-10-CM

## 2017-07-14 DIAGNOSIS — E039 Hypothyroidism, unspecified: Secondary | ICD-10-CM

## 2017-07-14 DIAGNOSIS — R7401 Elevation of levels of liver transaminase levels: Secondary | ICD-10-CM

## 2017-07-14 DIAGNOSIS — M8580 Other specified disorders of bone density and structure, unspecified site: Secondary | ICD-10-CM

## 2017-07-14 DIAGNOSIS — Z803 Family history of malignant neoplasm of breast: Secondary | ICD-10-CM | POA: Diagnosis not present

## 2017-07-14 LAB — POCT URINALYSIS DIPSTICK
Appearance: NORMAL
BILIRUBIN UA: NEGATIVE
Blood, UA: NEGATIVE
Glucose, UA: NEGATIVE
KETONES UA: NEGATIVE
Leukocytes, UA: NEGATIVE
Nitrite, UA: NEGATIVE
ODOR: NORMAL
Protein, UA: NEGATIVE
Spec Grav, UA: 1.01 (ref 1.010–1.025)
UROBILINOGEN UA: 0.2 U/dL
pH, UA: 6.5 (ref 5.0–8.0)

## 2017-07-14 NOTE — Progress Notes (Signed)
Subjective:    Patient ID: Shelly Fowler, female    DOB: 1975/08/11, 42 y.o.   MRN: [id number]  HPI 42 year old Female for health maintenance exam and evaluation of medical issues.  General health is excellent.  He tries to exercise and take care of herself.  She has a history of hypothyroidism since the age of 50.  Did not recall having thyroiditis receiving development of hypothyroidism.  She was a runner for many years.  She ran competitively at [facility].  She presented here for the first time in March 2017 referred by Alric Ran.  History of stress fractures and shins in the morning femur from running.  History of osteopenia.  Saw Dr. Edwinna Areola in May 2018 who thought she was premenopausal.  A bone density study was done with a T score of -2.4 and LS spine.  This was her first bone density study.  Apparently has family member with fragility fracture history.  2-year follow-up recommended.  She does do weightbearing exercise.  Treatment options were discussed with her by Dr. Sabra Heck.  These included bisphosphonate  and Evista.  Mother with history of stage IV breast cancer and mental illness.  Father with history of hypertension and prostate cancer.  One sister died from suicide with history of schizophrenia at age 3.  One sister in her mid 56s with no health issues.  One sister in her 54s with mood disorder.  Brother with history of hypertension.  Patient had test for CHEK2 mutation in [place] because of mother's history of breast cancer which was negative.  History of vitamin D deficiency.  Dr. Sabra Heck placed her on high-dose vitamin D.  Now she is on over-the-counter supplement.    Sometimes she forgets to take it.  She has a very mild elevation of SGPT.  This is 35 and normal is up to 29.  May have been due to a glass of cider that she consumed the evening before having her blood drawn.  She will have this repeated next week at her convenience along with vitamin D  level.  She had a glass of cider last evening and do not want to check it today.  With regard to TSH level is 3.62 and stable.  Lipid panel is normal.      Review of Systems  Constitutional: Negative.   Respiratory: Negative.   Cardiovascular: Negative.   Gastrointestinal: Negative.   Genitourinary: Negative.   Neurological: Negative.   Psychiatric/Behavioral: Negative.   All other systems reviewed and are negative.      Objective:   Physical Exam  Constitutional: She is oriented to person, place, and time. She appears well-developed and well-nourished.  HENT:  Head: Normocephalic.  Right Ear: External ear normal.  Left Ear: External ear normal.  Nose: Nose normal.  Mouth/Throat: Oropharynx is clear and moist.  Eyes: Pupils are equal, round, and reactive to light. Conjunctivae are normal. Right eye exhibits no discharge. Left eye exhibits no discharge. No scleral icterus.  Neck: Neck supple. No JVD present. No thyromegaly present.  Cardiovascular: Normal rate, regular rhythm and normal heart sounds. Exam reveals no friction rub.  No murmur heard. Pulmonary/Chest: Effort normal. No respiratory distress. She has no wheezes. She has no rales.  Breasts normal female without masses  Abdominal: Soft. Bowel sounds are normal. She exhibits no distension and no mass. There is no tenderness. There is no rebound and no guarding. No hernia.  Genitourinary:  Genitourinary Comments: Pap done 2018 was  normal.  Not repeated today.  Bimanual normal.  Musculoskeletal: She exhibits no edema.  Lymphadenopathy:    She has no cervical adenopathy.  Neurological: She is alert and oriented to person, place, and time. She displays normal reflexes. No cranial nerve deficit. She exhibits normal muscle tone. Coordination normal.  Skin: Skin is warm and dry.  Psychiatric: She has a normal mood and affect. Her behavior is normal. Judgment and thought content normal.          Assessment & Plan:    Hx of Vitamin D deficiency --take 2000 units vitamin D3 daily.  Level to be obtained next week  History of stress fractures related to running  Osteopenia with T score last year of -2.4.  Repeat next year.  Hypothyroidism-stable on thyroid replacement  Elevated SGPT-repeat next week  Plan: She will need bone density study test next year.  She will need Pap smear every 3 years.  Dr. Sabra Heck feels she is premenopausal.  Return in 1 year or as needed.  She will return next week for repeat liver panel because of slightly elevated SGPT which may be related to cider consumption and vitamin D level

## 2017-07-14 NOTE — Patient Instructions (Signed)
Return next week for repeat SGPT and vitamin D level.  Do not consume wine or cider the evening before coming for lab work.

## 2017-07-19 ENCOUNTER — Other Ambulatory Visit: Payer: 59 | Admitting: Internal Medicine

## 2017-07-19 DIAGNOSIS — R748 Abnormal levels of other serum enzymes: Secondary | ICD-10-CM | POA: Diagnosis not present

## 2017-07-19 DIAGNOSIS — E559 Vitamin D deficiency, unspecified: Secondary | ICD-10-CM | POA: Diagnosis not present

## 2017-07-20 LAB — HEPATIC FUNCTION PANEL
AG Ratio: 1.8 (calc) (ref 1.0–2.5)
ALBUMIN MSPROF: 4.4 g/dL (ref 3.6–5.1)
ALT: 41 U/L — AB (ref 6–29)
AST: 31 U/L — AB (ref 10–30)
Alkaline phosphatase (APISO): 57 U/L (ref 33–115)
BILIRUBIN DIRECT: 0.2 mg/dL (ref 0.0–0.2)
Globulin: 2.5 g/dL (calc) (ref 1.9–3.7)
Indirect Bilirubin: 0.8 mg/dL (calc) (ref 0.2–1.2)
TOTAL PROTEIN: 6.9 g/dL (ref 6.1–8.1)
Total Bilirubin: 1 mg/dL (ref 0.2–1.2)

## 2017-07-20 LAB — VITAMIN D 25 HYDROXY (VIT D DEFICIENCY, FRACTURES): Vit D, 25-Hydroxy: 33 ng/mL (ref 30–100)

## 2017-07-27 ENCOUNTER — Other Ambulatory Visit: Payer: Self-pay

## 2017-07-27 DIAGNOSIS — R748 Abnormal levels of other serum enzymes: Secondary | ICD-10-CM

## 2017-08-15 ENCOUNTER — Other Ambulatory Visit: Payer: 59 | Admitting: Internal Medicine

## 2017-08-15 DIAGNOSIS — R748 Abnormal levels of other serum enzymes: Secondary | ICD-10-CM

## 2017-08-16 ENCOUNTER — Other Ambulatory Visit (INDEPENDENT_AMBULATORY_CARE_PROVIDER_SITE_OTHER): Payer: Self-pay

## 2017-08-16 ENCOUNTER — Other Ambulatory Visit: Payer: Self-pay | Admitting: Internal Medicine

## 2017-08-16 ENCOUNTER — Ambulatory Visit (INDEPENDENT_AMBULATORY_CARE_PROVIDER_SITE_OTHER): Payer: 59 | Admitting: Internal Medicine

## 2017-08-16 VITALS — BP 126/80 | HR 76 | Temp 99.0°F | Ht 63.5 in

## 2017-08-16 DIAGNOSIS — R945 Abnormal results of liver function studies: Principal | ICD-10-CM

## 2017-08-16 DIAGNOSIS — R932 Abnormal findings on diagnostic imaging of liver and biliary tract: Secondary | ICD-10-CM | POA: Diagnosis not present

## 2017-08-16 DIAGNOSIS — R748 Abnormal levels of other serum enzymes: Secondary | ICD-10-CM | POA: Diagnosis not present

## 2017-08-16 DIAGNOSIS — R7989 Other specified abnormal findings of blood chemistry: Secondary | ICD-10-CM

## 2017-08-16 DIAGNOSIS — R740 Nonspecific elevation of levels of transaminase and lactic acid dehydrogenase [LDH]: Secondary | ICD-10-CM | POA: Diagnosis not present

## 2017-08-16 DIAGNOSIS — K828 Other specified diseases of gallbladder: Secondary | ICD-10-CM | POA: Diagnosis not present

## 2017-08-17 ENCOUNTER — Ambulatory Visit
Admission: RE | Admit: 2017-08-17 | Discharge: 2017-08-17 | Disposition: A | Discharge status: Home / Self Care | Payer: 59 | Source: Ambulatory Visit | Attending: Internal Medicine | Admitting: Internal Medicine

## 2017-08-17 DIAGNOSIS — R7989 Other specified abnormal findings of blood chemistry: Secondary | ICD-10-CM

## 2017-08-17 DIAGNOSIS — K838 Other specified diseases of biliary tract: Secondary | ICD-10-CM | POA: Diagnosis not present

## 2017-08-17 DIAGNOSIS — R945 Abnormal results of liver function studies: Principal | ICD-10-CM

## 2017-08-17 LAB — HEPATIC FUNCTION PANEL
AG Ratio: 1.8 (calc) (ref 1.0–2.5)
ALBUMIN MSPROF: 4.5 g/dL (ref 3.6–5.1)
ALKALINE PHOSPHATASE (APISO): 60 U/L (ref 33–115)
ALT: 43 U/L — AB (ref 6–29)
AST: 31 U/L — AB (ref 10–30)
Bilirubin, Direct: 0.1 mg/dL (ref 0.0–0.2)
Globulin: 2.5 g/dL (calc) (ref 1.9–3.7)
Indirect Bilirubin: 0.6 mg/dL (calc) (ref 0.2–1.2)
Total Bilirubin: 0.7 mg/dL (ref 0.2–1.2)
Total Protein: 7 g/dL (ref 6.1–8.1)

## 2017-08-17 LAB — TEST AUTHORIZATION

## 2017-08-17 LAB — IRON,TIBC AND FERRITIN PANEL
%SAT: 38 % (calc) (ref 16–45)
FERRITIN: 48 ng/mL (ref 16–232)
Iron: 106 ug/dL (ref 40–190)
TIBC: 282 mcg/dL (calc) (ref 250–450)

## 2017-08-21 LAB — HEPATITIS C ANTIBODY
Hepatitis C Ab: NONREACTIVE
SIGNAL TO CUT-OFF: 0.02 (ref <1.00)

## 2017-08-21 LAB — MITOCHONDRIAL ANTIBODIES: Mitochondrial M2 Ab, IgG: <=20 U

## 2017-08-21 LAB — ANA: Anti Nuclear Antibody(ANA): NEGATIVE

## 2017-08-21 LAB — HEPATITIS B CORE ANTIBODY, TOTAL: Hep B Core Total Ab: NONREACTIVE

## 2017-08-21 LAB — HEPATITIS B SURFACE ANTIBODY,QUALITATIVE: Hep B S Ab: NONREACTIVE

## 2017-08-21 LAB — HEPATITIS B SURFACE ANTIGEN: HEP B S AG: NONREACTIVE

## 2017-08-21 LAB — EXTRA LAV TOP TUBE

## 2017-09-10 ENCOUNTER — Encounter: Payer: Self-pay | Admitting: Internal Medicine

## 2017-09-10 NOTE — Patient Instructions (Signed)
Please call upon return from vacation so we can assess further

## 2017-09-10 NOTE — Progress Notes (Signed)
   Subjective:    Patient ID: Shelly EssexSarah L Crammer, female    DOB: 1975-06-29, 42 y.o.   MRN: [id number]  HPI she was here in late May for routine health maintenance exam and if that time we discovered a very mild elevation of her SGPT at 35.  She was drinking some cider in the evenings with her husband.  She cut that out.  On June 4 hepatic function panel repeated and SGOT was 31 normal being between 10 and 30 and SGPT was 41 which is gone up slightly.  We have repeated these once more and SGOT is 31 and SGPT is 43.  She is going on vacation for several weeks with her family.  She is concerned about this and we have agreed we will do some investigation into what could be causing this mild elevation.  Iron and iron binding capacity and ferritin are normal.  Hep C antibody is negative.  ANA is negative. AMA was negative.  Hep B titers were negative.  We have ordered a ultrasound of her  upper abdomen.   Review of Systems see above     Objective:   Physical Exam  15 minutes speaking with her about these issues today      Assessment & Plan:  Mild elevation in SGPT  Plan: She is going on her vacation for several weeks and will contact me when she returns.  Addendum: Ultrasound of the gallbladder shows a slight amount of sludge in the gallbladder otherwise negative.  My feeling is that this mild elevation is probably of no consequence but we may wind up sending her to gastroenterology for further evaluation if it persists.  We do know that in April 2019 she had normal liver functions.  SGOT was 19 and SGPT was 11.  They were also normal in 2017 at 14 and 10 respectively.

## 2017-11-09 ENCOUNTER — Telehealth: Payer: Self-pay

## 2017-11-09 NOTE — Telephone Encounter (Signed)
Patient called stated her insurance company denied 4 out 6 tests that we ordered on 08/16/17 due to diagnosis codes we used. I have called [employer] to provide additional diagnosis codes and was told by [employer] that they will resubmit this claim and it will take 30-45 days to hear from her insurance. I called the patient and left a detailed message letting her know.

## 2017-12-06 DIAGNOSIS — Z23 Encounter for immunization: Secondary | ICD-10-CM | POA: Diagnosis not present

## 2018-05-03 ENCOUNTER — Other Ambulatory Visit: Payer: Self-pay | Admitting: Internal Medicine

## 2018-05-03 NOTE — Telephone Encounter (Signed)
Due for CPE after May 30. Need to book before refilling

## 2018-05-04 NOTE — Telephone Encounter (Signed)
I do not see appt in EPIC

## 2018-07-06 ENCOUNTER — Encounter: Payer: Self-pay | Admitting: Obstetrics & Gynecology

## 2018-07-17 ENCOUNTER — Other Ambulatory Visit: Payer: 59 | Admitting: Internal Medicine

## 2018-07-18 ENCOUNTER — Encounter: Payer: 59 | Admitting: Internal Medicine

## 2018-07-26 ENCOUNTER — Other Ambulatory Visit: Payer: Self-pay | Admitting: Internal Medicine

## 2018-07-26 DIAGNOSIS — Z1231 Encounter for screening mammogram for malignant neoplasm of breast: Secondary | ICD-10-CM

## 2018-07-31 ENCOUNTER — Telehealth: Payer: Self-pay | Admitting: Obstetrics & Gynecology

## 2018-07-31 ENCOUNTER — Telehealth: Payer: Self-pay | Admitting: Internal Medicine

## 2018-07-31 ENCOUNTER — Other Ambulatory Visit: Payer: Self-pay | Admitting: Family Medicine

## 2018-07-31 DIAGNOSIS — E559 Vitamin D deficiency, unspecified: Secondary | ICD-10-CM

## 2018-07-31 DIAGNOSIS — M8588 Other specified disorders of bone density and structure, other site: Secondary | ICD-10-CM

## 2018-07-31 DIAGNOSIS — Z1231 Encounter for screening mammogram for malignant neoplasm of breast: Secondary | ICD-10-CM

## 2018-07-31 NOTE — Telephone Encounter (Signed)
Shelly Fowler called to say she was changing [facility], she would now be seeing Dr Derinda Late, and to inquire how to get records. I let her know new doctors office could fax signed request and we would send them.

## 2018-07-31 NOTE — Telephone Encounter (Signed)
Spoke with patient. She is ready to schedule BMD. Patient received MyChart message from [facility] last month stating it was time to schedule. Will route to Dr.Miller for order/Dx. [facility] will call [facility] to schedule. Routed to provider.

## 2018-07-31 NOTE — Telephone Encounter (Signed)
Patient would like to speak with nurse about bone scan testing.

## 2018-08-01 NOTE — Telephone Encounter (Signed)
Order has been placed.  Encounter closed.

## 2018-08-04 ENCOUNTER — Other Ambulatory Visit: Payer: Self-pay

## 2018-08-04 ENCOUNTER — Ambulatory Visit
Admission: RE | Admit: 2018-08-04 | Discharge: 2018-08-04 | Disposition: A | Discharge status: Home / Self Care | Payer: 59 | Source: Ambulatory Visit | Attending: Internal Medicine | Admitting: Internal Medicine

## 2018-08-04 DIAGNOSIS — Z1231 Encounter for screening mammogram for malignant neoplasm of breast: Secondary | ICD-10-CM

## 2018-08-10 ENCOUNTER — Telehealth: Payer: Self-pay | Admitting: Family Medicine

## 2018-08-10 NOTE — Telephone Encounter (Signed)
Faxed medical records to Dr Marylene Land

## 2018-08-15 ENCOUNTER — Ambulatory Visit
Admission: RE | Admit: 2018-08-15 | Discharge: 2018-08-15 | Disposition: A | Discharge status: Home / Self Care | Payer: 59 | Source: Ambulatory Visit | Attending: Obstetrics & Gynecology | Admitting: Obstetrics & Gynecology

## 2018-08-15 ENCOUNTER — Other Ambulatory Visit: Payer: Self-pay

## 2018-08-15 DIAGNOSIS — E559 Vitamin D deficiency, unspecified: Secondary | ICD-10-CM

## 2018-08-15 DIAGNOSIS — M8588 Other specified disorders of bone density and structure, other site: Secondary | ICD-10-CM

## 2018-08-23 ENCOUNTER — Other Ambulatory Visit: Payer: Self-pay | Admitting: Internal Medicine

## 2018-09-04 ENCOUNTER — Other Ambulatory Visit: Payer: Self-pay

## 2018-09-04 ENCOUNTER — Ambulatory Visit
Admission: RE | Admit: 2018-09-04 | Discharge: 2018-09-04 | Disposition: A | Discharge status: Home / Self Care | Payer: 59 | Source: Ambulatory Visit | Attending: Family Medicine | Admitting: Family Medicine

## 2018-09-04 ENCOUNTER — Other Ambulatory Visit: Payer: Self-pay | Admitting: Family Medicine

## 2018-09-04 DIAGNOSIS — M545 Low back pain, unspecified: Secondary | ICD-10-CM

## 2018-09-04 DIAGNOSIS — M542 Cervicalgia: Secondary | ICD-10-CM

## 2018-09-20 ENCOUNTER — Telehealth: Payer: Self-pay | Admitting: *Deleted

## 2018-09-20 NOTE — Telephone Encounter (Signed)
Called pt and mailbox was full, unable to lvm

## 2018-10-05 ENCOUNTER — Telehealth: Payer: Self-pay | Admitting: *Deleted

## 2018-10-05 NOTE — Telephone Encounter (Signed)
Called pt and lvm #1 to get pt scheduled for and ov

## 2018-10-06 ENCOUNTER — Other Ambulatory Visit: Payer: 59 | Admitting: Internal Medicine

## 2018-10-12 ENCOUNTER — Telehealth: Payer: Self-pay | Admitting: *Deleted

## 2018-10-12 NOTE — Telephone Encounter (Signed)
Called pt and lvm #2 

## 2018-10-13 ENCOUNTER — Other Ambulatory Visit: Payer: 59 | Admitting: Internal Medicine

## 2018-10-17 ENCOUNTER — Encounter: Payer: 59 | Admitting: Internal Medicine

## 2018-10-18 ENCOUNTER — Ambulatory Visit (INDEPENDENT_AMBULATORY_CARE_PROVIDER_SITE_OTHER): Payer: 59 | Admitting: Physical Medicine and Rehabilitation

## 2018-10-18 ENCOUNTER — Encounter: Payer: Self-pay | Admitting: Physical Medicine and Rehabilitation

## 2018-10-18 VITALS — BP 139/92 | HR 50

## 2018-10-18 DIAGNOSIS — M5416 Radiculopathy, lumbar region: Secondary | ICD-10-CM | POA: Diagnosis not present

## 2018-10-18 DIAGNOSIS — M5136 Other intervertebral disc degeneration, lumbar region: Secondary | ICD-10-CM | POA: Diagnosis not present

## 2018-10-18 DIAGNOSIS — M7918 Myalgia, other site: Secondary | ICD-10-CM | POA: Diagnosis not present

## 2018-10-18 DIAGNOSIS — M51369 Other intervertebral disc degeneration, lumbar region without mention of lumbar back pain or lower extremity pain: Secondary | ICD-10-CM

## 2018-10-18 DIAGNOSIS — M542 Cervicalgia: Secondary | ICD-10-CM

## 2018-10-18 NOTE — Progress Notes (Signed)
  Numeric Pain Rating Scale and Functional Assessment Average Pain 6 Pain Right Now 5 My pain is intermittent and dull Pain is worse with: running Pain improves with: rest   In the last MONTH (on 0-10 scale) has pain interfered with the following?  1. General activity like being  able to carry out your everyday physical activities such as walking, climbing stairs, carrying groceries, or moving a chair?  Rating(7)  2. Relation with others like being able to carry out your usual social activities and roles such as  activities at home, at work and in your community. Rating(7)  3. Enjoyment of life such that you have  been bothered by emotional problems such as feeling anxious, depressed or irritable?  Rating(0)

## 2018-10-26 ENCOUNTER — Other Ambulatory Visit: Payer: Self-pay

## 2018-10-26 ENCOUNTER — Ambulatory Visit
Admission: RE | Admit: 2018-10-26 | Discharge: 2018-10-26 | Disposition: A | Discharge status: Home / Self Care | Payer: 59 | Source: Ambulatory Visit | Attending: Physical Medicine and Rehabilitation | Admitting: Physical Medicine and Rehabilitation

## 2018-10-26 DIAGNOSIS — M5416 Radiculopathy, lumbar region: Secondary | ICD-10-CM

## 2018-10-26 DIAGNOSIS — M5136 Other intervertebral disc degeneration, lumbar region: Secondary | ICD-10-CM

## 2018-11-01 ENCOUNTER — Ambulatory Visit: Payer: 59 | Admitting: Physical Medicine and Rehabilitation

## 2018-11-19 ENCOUNTER — Encounter: Payer: Self-pay | Admitting: Physical Medicine and Rehabilitation
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
## 2018-11-22 ENCOUNTER — Ambulatory Visit (INDEPENDENT_AMBULATORY_CARE_PROVIDER_SITE_OTHER): Payer: 59 | Admitting: Physical Medicine and Rehabilitation

## 2018-11-22 VITALS — BP 108/70 | HR 52

## 2018-11-22 DIAGNOSIS — M51369 Other intervertebral disc degeneration, lumbar region without mention of lumbar back pain or lower extremity pain: Secondary | ICD-10-CM

## 2018-11-22 DIAGNOSIS — M5416 Radiculopathy, lumbar region: Secondary | ICD-10-CM | POA: Diagnosis not present

## 2018-11-22 DIAGNOSIS — M5136 Other intervertebral disc degeneration, lumbar region: Secondary | ICD-10-CM | POA: Diagnosis not present

## 2018-11-22 DIAGNOSIS — M47816 Spondylosis without myelopathy or radiculopathy, lumbar region: Secondary | ICD-10-CM

## 2018-11-22 DIAGNOSIS — M7918 Myalgia, other site: Secondary | ICD-10-CM

## 2018-11-22 NOTE — Progress Notes (Signed)
Here for MRI review. No changes with pain since last visit.  Numeric Pain Rating Scale and Functional Assessment Average Pain 3   In the last MONTH (on 0-10 scale) has pain interfered with the following?  1. General activity like being  able to carry out your everyday physical activities such as walking, climbing stairs, carrying groceries, or moving a chair?  Rating(3

## 2018-12-05 ENCOUNTER — Other Ambulatory Visit: Payer: Self-pay | Admitting: Internal Medicine

## 2019-03-27 DIAGNOSIS — Z029 Encounter for administrative examinations, unspecified: Secondary | ICD-10-CM

## 2019-03-29 ENCOUNTER — Telehealth: Payer: Self-pay | Admitting: Internal Medicine

## 2019-03-29 NOTE — Telephone Encounter (Signed)
Received payment for medical records request to [employer], so I faxed them thru releases to [phone]

## 2019-09-18 ENCOUNTER — Other Ambulatory Visit: Payer: Self-pay | Admitting: Family Medicine

## 2019-09-18 DIAGNOSIS — Z1231 Encounter for screening mammogram for malignant neoplasm of breast: Secondary | ICD-10-CM

## 2019-10-10 ENCOUNTER — Other Ambulatory Visit: Payer: Self-pay

## 2019-10-10 ENCOUNTER — Ambulatory Visit
Admission: RE | Admit: 2019-10-10 | Discharge: 2019-10-10 | Disposition: A | Discharge status: Home / Self Care | Payer: 59 | Source: Ambulatory Visit | Attending: Family Medicine | Admitting: Family Medicine

## 2019-10-10 DIAGNOSIS — Z1231 Encounter for screening mammogram for malignant neoplasm of breast: Secondary | ICD-10-CM

## 2019-10-12 ENCOUNTER — Other Ambulatory Visit: Payer: Self-pay | Admitting: Family Medicine

## 2019-10-12 DIAGNOSIS — R928 Other abnormal and inconclusive findings on diagnostic imaging of breast: Secondary | ICD-10-CM

## 2019-10-15 ENCOUNTER — Ambulatory Visit
Admission: RE | Admit: 2019-10-15 | Discharge: 2019-10-15 | Disposition: A | Discharge status: Home / Self Care | Payer: 59 | Source: Ambulatory Visit | Attending: Family Medicine | Admitting: Family Medicine

## 2019-10-15 ENCOUNTER — Other Ambulatory Visit: Payer: Self-pay

## 2019-10-15 DIAGNOSIS — R928 Other abnormal and inconclusive findings on diagnostic imaging of breast: Secondary | ICD-10-CM

## 2019-10-27 ENCOUNTER — Encounter: Payer: Self-pay | Admitting: Physical Medicine and Rehabilitation

## 2019-10-27 NOTE — Progress Notes (Signed)
Shelly Fowler - 44 y.o. female MRN [id number]  Date of birth: Feb 05, 1976  Office Visit Note: Visit Date: 11/22/2018 PCP: Mosetta Putt, MD Referred by: Mosetta Putt, MD  Subjective: Chief Complaint  Patient presents with  . Lower Back - Pain   HPI: Shelly Fowler is a 44 y.o. female who comes in today For follow-up evaluation and management of chronic worsening back pain.  Please see our prior extensive new patient note for further details on her case.  Since have seen her last she has obtained MRI of the lumbar spine.  This is reviewed with her using spine models and imaging.  Report is reviewed below.  She basically has facet arthropathy which is moderate at L4-5 and L5-S1 as well as degenerative disc changes at L5-S1.  We tried to explain to her that degenerative disc changes by itself is usually not a painful condition this is somewhat controversial.  It does cause some narrowing of the foramen bilaterally and combined with the arthropathy can cause narrowing of the nerves exiting.  Overall she is doing quite well and really was more concerned that there was some severe issue in her lumbar spine.  I did reassure her that this was not the case and that her spine actually looks fairly well except for arthritic changes.  I discussed with her medial branch blocks leading to radiofrequency ablation for definitive treatment of back pain from facet mediated pain.  We talked about epidural injection for degenerative changes and foraminal narrowing.  We talked about medication management and core strengthening.  Her exam today is nonfocal still pain with extension going from sit to stand.  No focal weakness.  Review of Systems  Musculoskeletal: Positive for back pain.  All other systems reviewed and are negative.  Otherwise per HPI.  Assessment & Plan: Visit Diagnoses:  1. Degenerative lumbar disc   2. Radiculopathy, lumbar region   3. Myofascial pain syndrome   4.  Spondylosis without myelopathy or radiculopathy, lumbar region     Plan: No additional findings.   Meds & Orders: No orders of the defined types were placed in this encounter.  No orders of the defined types were placed in this encounter.   Follow-up: Return if symptoms worsen or fail to improve.   Procedures: No procedures performed  No notes on file   Clinical History: MRI LUMBAR SPINE WITHOUT CONTRAST  TECHNIQUE: Multiplanar, multisequence MR imaging of the lumbar spine was performed. No intravenous contrast was administered.  COMPARISON:  None.  FINDINGS: Segmentation: There are five lumbar type vertebral bodies. The last full intervertebral disc space is labeled L5-S1. This correlates with the lumbar radiographs  Alignment:  Normal  Vertebrae: No bone lesions or fractures. Benign L3 hemangioma noted. Endplate reactive changes at L5-S1.  Conus medullaris and cauda equina: Conus extends to the L1 level. Conus and cauda equina appear normal.  Paraspinal and other soft tissues: No significant findings.  Disc levels:  T12-L1: No significant findings.  L1-2: No significant findings.  L2-3: No significant findings.  L3-4: Mild facet disease but no disc protrusions, spinal or foraminal stenosis.  L4-5: Moderate facet disease but no disc protrusions, spinal or foraminal stenosis.  L5-S1: Moderate degenerative disc disease with disc space narrowing, disc desiccation and endplate reactive changes. There is a bulging degenerated annulus and moderate facet disease with mild mass effect on the ventral thecal sac but no significant disc protrusions, spinal or foraminal stenosis.  IMPRESSION: 1. Degenerative disc disease mainly at  L5-S1 as detailed above. Bulging degenerated annulus but no significant disc protrusion, spinal or foraminal stenosis. 2. The other intervertebral disc spaces are unremarkable.   Electronically Signed   By: Rudie Meyer M.D.   On: 10/26/2018 15:53 ---- LUMBAR SPINE - COMPLETE 4+ VIEW  COMPARISON: None.  FINDINGS: Osseous alignment is normal. Bone mineralization is normal. No fracture line or displaced fracture fragment seen. No compression fracture deformity. Disc desiccation at the L5-S1 level with associated disc space narrowing and endplate sclerosis. At least mild degenerative facet hypertrophy within the lower lumbar spine. No evidence of pars interarticularis defect. Visualized paravertebral soft tissues are unremarkable.  IMPRESSION: 1. No acute findings. No osseous fracture or dislocation. 2. Degenerative changes at the L5-S1 level, as detailed above.   Electronically Signed By: Bary Richard M.D. On: 09/05/2018 08:01 --------- EXAM: CERVICAL SPINE - COMPLETE 4+ VIEW  COMPARISON: None.  FINDINGS: There is no evidence of cervical spine fracture or prevertebral soft tissue swelling. Alignment is normal. No other significant bone abnormalities are identified.  IMPRESSION: Negative cervical spine radiographs.   Electronically Signed By: Lupita Raider M.D. On: 09/05/2018 08:11   She reports that she has never smoked. She has never used smokeless tobacco. No results for input(s): HGBA1C, LABURIC in the last 8760 hours.  Objective:  VS:  HT:    WT:   BMI:     BP:108/70  HR:(!) 52bpm  TEMP: ( )  RESP:  Physical Exam Constitutional:      General: She is not in acute distress.    Appearance: Normal appearance. She is not ill-appearing.  HENT:     Head: Normocephalic and atraumatic.     Right Ear: External ear normal.     Left Ear: External ear normal.  Eyes:     Extraocular Movements: Extraocular movements intact.  Cardiovascular:     Rate and Rhythm: Normal rate.     Pulses: Normal pulses.  Musculoskeletal:     Right lower leg: No edema.     Left lower leg: No edema.     Comments: Patient has good distal strength with no pain over the greater  trochanters.  No clonus or focal weakness.  Skin:    Findings: No erythema, lesion or rash.  Neurological:     General: No focal deficit present.     Mental Status: She is alert and oriented to person, place, and time.     Sensory: No sensory deficit.     Motor: No weakness or abnormal muscle tone.     Coordination: Coordination normal.  Psychiatric:        Mood and Affect: Mood normal.        Behavior: Behavior normal.     Ortho Exam  Imaging: No results found.  Past Medical/Family/Surgical/Social History: Medications & Allergies reviewed per EMR, new medications updated. Patient Active Problem List   Diagnosis Date Noted  . Hypothyroidism 05/15/2015  . Vitamin D deficiency 05/15/2015  . Family history of breast cancer in mother 05/15/2015   Past Medical History:  Diagnosis Date  . Abnormal Pap smear of cervix ~2008   f/u normal   . Anemia   . Thyroid disease    Family History  Problem Relation Age of Onset  . Breast cancer Mother        has abnormal genetic testing  . Diabetes Mother   . Cancer Father        Prostate  . Diabetes Paternal Grandmother    Past Surgical  History:  Procedure Laterality Date  . TONSILLECTOMY     age 56   Social History   Occupational History  . Not on file  Tobacco Use  . Smoking status: Never Smoker  . Smokeless tobacco: Never Used  Vaping Use  . Vaping Use: Never used  Substance and Sexual Activity  . Alcohol use: Yes    Alcohol/week: 6.0 standard drinks    Types: 6 Glasses of wine per week  . Drug use: No  . Sexual activity: Not Currently    Birth control/protection: None

## 2020-09-02 ENCOUNTER — Other Ambulatory Visit: Payer: Self-pay | Admitting: Family Medicine

## 2020-09-02 DIAGNOSIS — Z1231 Encounter for screening mammogram for malignant neoplasm of breast: Secondary | ICD-10-CM

## 2020-10-24 ENCOUNTER — Ambulatory Visit
Admission: RE | Admit: 2020-10-24 | Discharge: 2020-10-24 | Disposition: A | Discharge status: Home / Self Care | Payer: Self-pay | Source: Ambulatory Visit | Attending: Family Medicine | Admitting: Family Medicine

## 2020-10-24 ENCOUNTER — Other Ambulatory Visit: Payer: Self-pay

## 2020-10-24 ENCOUNTER — Ambulatory Visit: Payer: 59

## 2020-10-24 DIAGNOSIS — Z1231 Encounter for screening mammogram for malignant neoplasm of breast: Secondary | ICD-10-CM

## 2020-12-11 ENCOUNTER — Other Ambulatory Visit: Payer: Self-pay | Admitting: Family Medicine

## 2020-12-11 DIAGNOSIS — Z803 Family history of malignant neoplasm of breast: Secondary | ICD-10-CM

## 2020-12-11 DIAGNOSIS — R922 Inconclusive mammogram: Secondary | ICD-10-CM

## 2020-12-11 DIAGNOSIS — R9230 Dense breasts, unspecified: Secondary | ICD-10-CM

## 2020-12-11 DIAGNOSIS — M8580 Other specified disorders of bone density and structure, unspecified site: Secondary | ICD-10-CM

## 2020-12-12 ENCOUNTER — Other Ambulatory Visit: Payer: Self-pay

## 2020-12-12 ENCOUNTER — Ambulatory Visit
Admission: RE | Admit: 2020-12-12 | Discharge: 2020-12-12 | Disposition: A | Discharge status: Home / Self Care | Payer: BC Managed Care – PPO | Source: Ambulatory Visit | Attending: Family Medicine | Admitting: Family Medicine

## 2020-12-12 DIAGNOSIS — M8580 Other specified disorders of bone density and structure, unspecified site: Secondary | ICD-10-CM

## 2020-12-15 ENCOUNTER — Other Ambulatory Visit: Payer: BC Managed Care – PPO

## 2020-12-27 ENCOUNTER — Ambulatory Visit
Admission: RE | Admit: 2020-12-27 | Discharge: 2020-12-27 | Disposition: A | Discharge status: Home / Self Care | Payer: No Typology Code available for payment source | Source: Ambulatory Visit | Attending: Family Medicine | Admitting: Family Medicine

## 2020-12-27 ENCOUNTER — Other Ambulatory Visit: Payer: Self-pay

## 2020-12-27 DIAGNOSIS — R922 Inconclusive mammogram: Secondary | ICD-10-CM

## 2020-12-27 DIAGNOSIS — Z803 Family history of malignant neoplasm of breast: Secondary | ICD-10-CM

## 2020-12-27 DIAGNOSIS — R9230 Dense breasts, unspecified: Secondary | ICD-10-CM

## 2020-12-27 MED ORDER — GADOBUTROL 1 MMOL/ML IV SOLN
6.0000 mL | Freq: Once | INTRAVENOUS | Status: AC | PRN
  Administered 2020-12-27: 6 mL via INTRAVENOUS

## 2021-01-20 ENCOUNTER — Other Ambulatory Visit: Payer: Self-pay | Admitting: Family Medicine

## 2021-01-21 LAB — SARS CORONAVIRUS 2 (TAT 6-24 HRS): SARS Coronavirus 2: POSITIVE — AB

## 2021-02-17 ENCOUNTER — Other Ambulatory Visit: Payer: Self-pay | Admitting: Family Medicine

## 2021-04-09 ENCOUNTER — Other Ambulatory Visit: Payer: Self-pay

## 2021-04-09 ENCOUNTER — Ambulatory Visit (AMBULATORY_SURGERY_CENTER): Payer: BC Managed Care – PPO | Admitting: *Deleted

## 2021-04-09 VITALS — Ht 64.0 in | Wt 135.0 lb

## 2021-04-09 DIAGNOSIS — Z1211 Encounter for screening for malignant neoplasm of colon: Secondary | ICD-10-CM

## 2021-04-09 MED ORDER — NA SULFATE-K SULFATE-MG SULF 17.5-3.13-1.6 GM/177ML PO SOLN: 2.0000 | Freq: Once | ORAL | 0 refills | Status: AC

## 2021-04-09 NOTE — Progress Notes (Signed)

## 2021-04-17 ENCOUNTER — Encounter: Payer: Self-pay | Admitting: Gastroenterology

## 2021-04-23 ENCOUNTER — Encounter: Payer: Self-pay | Admitting: Gastroenterology

## 2021-04-23 ENCOUNTER — Other Ambulatory Visit: Payer: Self-pay

## 2021-04-23 ENCOUNTER — Ambulatory Visit (AMBULATORY_SURGERY_CENTER): Payer: BC Managed Care – PPO | Admitting: Gastroenterology

## 2021-04-23 VITALS — BP 95/65 | HR 65 | Temp 98.7°F | Resp 11 | Ht 63.0 in | Wt 135.0 lb

## 2021-04-23 DIAGNOSIS — Z1211 Encounter for screening for malignant neoplasm of colon: Secondary | ICD-10-CM | POA: Diagnosis not present

## 2021-04-23 MED ORDER — SODIUM CHLORIDE 0.9 % IV SOLN: 500.0000 mL | INTRAVENOUS | Status: DC

## 2021-04-23 NOTE — Progress Notes (Signed)
[facility] History and Physical ? ? ?Primary Care Physician:  Derinda Late, MD ? ? ?Reason for Procedure:   Colon cancer screening ? ?Plan:    colonoscopy ? ? ? ? ?HPI: Shelly Fowler is a 46 y.o. female  here for colonoscopy screening - first time exam. Patient denies any bowel symptoms at this time. No family history of colon cancer known. Otherwise feels well without any cardiopulmonary symptoms.  ? ? ?Past Medical History:  ?Diagnosis Date  ? Abnormal Pap smear of cervix ~2008  ? f/u normal   ? Anemia   ? Thyroid disease   ? ? ?Past Surgical History:  ?Procedure Laterality Date  ? TONSILLECTOMY    ? age 21  ? ? ?Prior to Admission medications   ?Medication Sig Start Date End Date Taking? Authorizing Provider  ?levothyroxine (SYNTHROID, LEVOTHROID) 88 MCG tablet TAKE 1 TABLET BY MOUTH EVERY DAY 05/04/18  Yes Baxley, Cresenciano Lick, MD  ? ? ?Current Outpatient Medications  ?Medication Sig Dispense Refill  ? levothyroxine (SYNTHROID, LEVOTHROID) 88 MCG tablet TAKE 1 TABLET BY MOUTH EVERY DAY 30 tablet 2  ? ?Current Facility-Administered Medications  ?Medication Dose Route Frequency Provider Last Rate Last Admin  ? 0.9 %  sodium chloride infusion  500 mL Intravenous Continuous Nyrah Demos, Carlota Raspberry, MD      ? ? ?Allergies as of 04/23/2021  ? (No Known Allergies)  ? ? ?Family History  ?Problem Relation Age of Onset  ? Breast cancer Mother   ?     has abnormal genetic testing  ? Diabetes Mother   ? Cancer Father   ?     Prostate  ? Diabetes Paternal Grandmother   ? Colon polyps Neg Hx   ? Colon cancer Neg Hx   ? Esophageal cancer Neg Hx   ? Rectal cancer Neg Hx   ? Stomach cancer Neg Hx   ? ? ?Social History  ? ?Socioeconomic History  ? Marital status: Married  ?  Spouse name: Not on file  ? Number of children: Not on file  ? Years of education: Not on file  ? Highest education level: Not on file  ?Occupational History  ? Not on file  ?Tobacco Use  ? Smoking status: Never  ? Smokeless tobacco: Never   ?Vaping Use  ? Vaping Use: Never used  ?Substance and Sexual Activity  ? Alcohol use: Yes  ?  Alcohol/week: 6.0 standard drinks  ?  Types: 6 Glasses of wine per week  ? Drug use: No  ? Sexual activity: Not Currently  ?  Birth control/protection: None  ?Other Topics Concern  ? Not on file  ?Social History Narrative  ? Not on file  ? ?Social Determinants of Health  ? ?Financial Resource Strain: Not on file  ?Food Insecurity: Not on file  ?Transportation Needs: Not on file  ?Physical Activity: Not on file  ?Stress: Not on file  ?Social Connections: Not on file  ?Intimate Partner Violence: Not on file  ? ? ?Review of Systems: ?All other review of systems negative except as mentioned in the HPI. ? ?Physical Exam: ?Vital signs ?BP (!) 100/55   Pulse 67   Temp 98.7 ?F (37.1 ?C) (Temporal)   Ht 5\' 3"  (1.6 m)   Wt 135 lb (61.2 kg)   SpO2 99%   BMI 23.91 kg/m?  ? ?General:   Alert,  Well-developed, pleasant and cooperative in NAD ?Lungs:  Clear throughout to auscultation.   ?Heart:  Regular rate  and rhythm ?Abdomen:  Soft, nontender and nondistended.   ?Neuro/Psych:  Alert and cooperative. Normal mood and affect. A and O x 3 ? ?Jolly Mango, MD ?[facility] ? ? ?

## 2021-04-23 NOTE — Progress Notes (Signed)
Pt's states no medical or surgical changes since previsit or office visit. 

## 2021-04-23 NOTE — Op Note (Signed)
[facility] ?Patient Name: Shelly Fowler ?Procedure Date: 04/23/2021 11:44 AM ?MRN: [id number] ?Endoscopist: Willaim Rayas. Adela Lank , MD ?Age: 46 ?Referring MD:  ?Date of Birth: 24-Jan-1976 ?Gender: Female ?Account #: [id number] ?Procedure:                Colonoscopy ?Indications:              Screening for colorectal malignant neoplasm, This  ?                          is the patient's first colonoscopy ?Medicines:                Monitored Anesthesia Care ?Procedure:                Pre-Anesthesia Assessment: ?                          - Prior to the procedure, a History and Physical  ?                          was performed, and patient medications and  ?                          allergies were reviewed. The patient's tolerance of  ?                          previous anesthesia was also reviewed. The risks  ?                          and benefits of the procedure and the sedation  ?                          options and risks were discussed with the patient.  ?                          All questions were answered, and informed consent  ?                          was obtained. Prior Anticoagulants: The patient has  ?                          taken no previous anticoagulant or antiplatelet  ?                          agents. ASA Grade Assessment: II - A patient with  ?                          mild systemic disease. After reviewing the risks  ?                          and benefits, the patient was deemed in  ?                          satisfactory condition to undergo the procedure. ?  After obtaining informed consent, the colonoscope  ?                          was passed under direct vision. Throughout the  ?                          procedure, the patient's blood pressure, pulse, and  ?                          oxygen saturations were monitored continuously. The  ?                          Olympus [id number]-[id number]) Colonoscope was  ?                          introduced through the  anus and advanced to the the  ?                          cecum, identified by appendiceal orifice and  ?                          ileocecal valve. The colonoscopy was performed  ?                          without difficulty. The patient tolerated the  ?                          procedure well. The quality of the bowel  ?                          preparation was good. The ileocecal valve,  ?                          appendiceal orifice, and rectum were photographed. ?Scope In: 11:55:06 AM ?Scope Out: 12:14:53 PM ?Scope Withdrawal Time: 0 hours 14 minutes 57 seconds  ?Total Procedure Duration: 0 hours 19 minutes 47 seconds  ?Findings:                 The perianal and digital rectal examinations were  ?                          normal. ?                          Multiple medium-mouthed diverticula were found in  ?                          the left colon, creating some areas of angulated  ?                          turns / luminal narrowing. ?                          The colon was tortuous. ?  Internal hemorrhoids were found during  ?                          retroflexion. The hemorrhoids were small. ?                          The exam was otherwise without abnormality. ?Complications:            No immediate complications. Estimated blood loss:  ?                          None. ?Estimated Blood Loss:     Estimated blood loss: none. ?Impression:               - Diverticulosis in the left colon. ?                          - Tortuous colon. ?                          - Internal hemorrhoids. ?                          - The examination was otherwise normal. ?                          - No polyps ?Recommendation:           - Patient has a contact number available for  ?                          emergencies. The signs and symptoms of potential  ?                          delayed complications were discussed with the  ?                          patient. Return to normal activities tomorrow.  ?                           Written discharge instructions were provided to the  ?                          patient. ?                          - Resume previous diet. ?                          - Continue present medications. ?                          - Repeat colonoscopy in 10 years for screening  ?                          purposes. ?Willaim Rayas. Bryahna Lesko, MD ?04/23/2021 12:20:25 PM ?This report has been signed electronically. ?

## 2021-04-23 NOTE — Patient Instructions (Signed)

## 2021-04-23 NOTE — Progress Notes (Signed)
Sedate, gd SR, tolerated procedure well, VSS, report to RN 

## 2021-04-27 ENCOUNTER — Telehealth: Payer: Self-pay

## 2021-04-27 NOTE — Telephone Encounter (Signed)
Left message on follow up call. 

## 2021-04-27 NOTE — Telephone Encounter (Signed)
?  Follow up Call- ? ?Call back number 04/23/2021  ?Post procedure Call Back phone  # [phone]  ?Permission to leave phone message Yes  ?Some recent data might be hidden  ?  ? ?Patient questions: ? ?Do you have a fever, pain , or abdominal swelling? No. ?Pain Score  0 * ? ?Have you tolerated food without any problems? Yes.   ? ?Have you been able to return to your normal activities? Yes.   ? ?Do you have any questions about your discharge instructions: ?Diet   No. ?Medications  No. ?Follow up visit  No. ? ?Do you have questions or concerns about your Care? No. ? ?Actions: ?* If pain score is 4 or above: ?No action needed, pain <4. ? ? ?

## 2021-11-18 ENCOUNTER — Ambulatory Visit
Admission: RE | Admit: 2021-11-18 | Discharge: 2021-11-18 | Disposition: A | Discharge status: Home / Self Care | Payer: BC Managed Care – PPO | Source: Ambulatory Visit | Attending: Family Medicine | Admitting: Family Medicine

## 2021-11-18 ENCOUNTER — Other Ambulatory Visit: Payer: Self-pay | Admitting: Family Medicine

## 2021-11-18 DIAGNOSIS — M25512 Pain in left shoulder: Secondary | ICD-10-CM

## 2021-11-24 ENCOUNTER — Other Ambulatory Visit: Payer: Self-pay | Admitting: Family Medicine

## 2021-11-24 DIAGNOSIS — Z1231 Encounter for screening mammogram for malignant neoplasm of breast: Secondary | ICD-10-CM

## 2021-12-01 ENCOUNTER — Other Ambulatory Visit: Payer: Self-pay | Admitting: Family Medicine

## 2021-12-01 DIAGNOSIS — R1032 Left lower quadrant pain: Secondary | ICD-10-CM

## 2021-12-02 ENCOUNTER — Ambulatory Visit
Admission: RE | Admit: 2021-12-02 | Discharge: 2021-12-02 | Disposition: A | Discharge status: Home / Self Care | Payer: BC Managed Care – PPO | Source: Ambulatory Visit | Attending: Family Medicine | Admitting: Family Medicine

## 2021-12-02 DIAGNOSIS — R1032 Left lower quadrant pain: Secondary | ICD-10-CM

## 2021-12-02 MED ORDER — IOPAMIDOL (ISOVUE-300) INJECTION 61%
100.0000 mL | Freq: Once | INTRAVENOUS | Status: AC | PRN
  Administered 2021-12-02: 100 mL via INTRAVENOUS

## 2021-12-31 ENCOUNTER — Ambulatory Visit: Payer: BC Managed Care – PPO

## 2022-01-28 ENCOUNTER — Ambulatory Visit
Admission: RE | Admit: 2022-01-28 | Discharge: 2022-01-28 | Disposition: A | Discharge status: Home / Self Care | Payer: BC Managed Care – PPO | Source: Ambulatory Visit | Attending: Family Medicine | Admitting: Family Medicine

## 2022-01-28 DIAGNOSIS — Z1231 Encounter for screening mammogram for malignant neoplasm of breast: Secondary | ICD-10-CM

## 2022-05-11 ENCOUNTER — Other Ambulatory Visit: Payer: Self-pay | Admitting: Family Medicine

## 2022-05-11 DIAGNOSIS — K5792 Diverticulitis of intestine, part unspecified, without perforation or abscess without bleeding: Secondary | ICD-10-CM

## 2022-05-11 DIAGNOSIS — K5732 Diverticulitis of large intestine without perforation or abscess without bleeding: Secondary | ICD-10-CM

## 2022-05-12 ENCOUNTER — Ambulatory Visit
Admission: RE | Admit: 2022-05-12 | Discharge: 2022-05-12 | Disposition: A | Discharge status: Home / Self Care | Payer: BC Managed Care – PPO | Source: Ambulatory Visit | Attending: Family Medicine | Admitting: Family Medicine

## 2022-05-12 DIAGNOSIS — K5732 Diverticulitis of large intestine without perforation or abscess without bleeding: Secondary | ICD-10-CM

## 2022-05-12 DIAGNOSIS — K5792 Diverticulitis of intestine, part unspecified, without perforation or abscess without bleeding: Secondary | ICD-10-CM

## 2022-05-12 MED ORDER — IOPAMIDOL (ISOVUE-370) INJECTION 76%
80.0000 mL | Freq: Once | INTRAVENOUS | Status: AC | PRN
  Administered 2022-05-12: 80 mL via INTRAVENOUS

## 2022-06-22 ENCOUNTER — Encounter: Payer: Self-pay | Admitting: Gastroenterology

## 2022-06-22 ENCOUNTER — Ambulatory Visit (INDEPENDENT_AMBULATORY_CARE_PROVIDER_SITE_OTHER): Payer: BC Managed Care – PPO | Admitting: Gastroenterology

## 2022-06-22 VITALS — BP 122/68 | HR 74 | Ht 63.0 in | Wt 125.0 lb

## 2022-06-22 DIAGNOSIS — K5732 Diverticulitis of large intestine without perforation or abscess without bleeding: Secondary | ICD-10-CM

## 2022-06-22 NOTE — Patient Instructions (Addendum)
If your blood pressure at your visit was 140/90 or greater, please contact your primary care physician to follow up on this. ______________________________________________________  If you are age 48 or older, your body mass index should be between 23-30. Your Body mass index is 22.14 kg/m. If this is out of the aforementioned range listed, please consider follow up with your Primary Care Provider.  If you are age 60 or younger, your body mass index should be between 19-25. Your Body mass index is 22.14 kg/m. If this is out of the aformentioned range listed, please consider follow up with your Primary Care Provider.  ________________________________________________________  The [facility] providers would like to encourage you to use [facility] to communicate with providers for non-urgent requests or questions.  Due to long hold times on the telephone, sending your provider a message by [facility] may be a faster and more efficient way to get a response.  Please allow 48 business hours for a response.  Please remember that this is for non-urgent requests.  _______________________________________________________  Due to recent changes in healthcare laws, you may see the results of your imaging and laboratory studies on MyChart before your provider has had a chance to review them.  We understand that in some cases there may be results that are confusing or concerning to you. Not all laboratory results come back in the same time frame and the provider may be waiting for multiple results in order to interpret others.  Please give [place] 48 hours in order for your provider to thoroughly review all the results before contacting the office for clarification of your results.   It was a pleasure to see you today!  Thank you for trusting me with your gastrointestinal care!

## 2022-06-22 NOTE — Progress Notes (Signed)
Gastroenterology Consult Note:  History: Shelly Fowler 06/22/2022  Referring provider: Mosetta Putt, MD  Reason for consult/chief complaint: Diverticulitis (Had 2 episodes of flare ups in the past 6 months)   Subjective  HPI: Shelly Fowler is self-referred back to see [place] for recurrent diverticulitis.  She saw my partner Dr. Adela Lank for routine colonoscopy last year, report of which is on file.  Acute sigmoid diverticulitis confirmed on CT scan treated with antibiotic by primary care October 2023, and another episode 2 months ago.  She had 24 to 48 hours of left flank and upper quadrant abdominal pain last October, had a telemedicine appointment with her physician and was prescribed ciprofloxacin and Flagyl.  This gave her significant digestive upset, so the Flagyl was discontinued.  She felt worse in the next few days had a CT scan showing diverticulitis noted below, that was treated with Augmentin.  Her symptoms gradually resolved and she went back to feeling normal afterward. In early March of this year she had recurrence of similar symptoms for a day or 2, again call primary care and was treated with Augmentin.  She initially started feeling better, then worsened with more diffuse abdominal pain and general malaise with loss of appetite.  Primary care stopped Augmentin and treated with moxifloxacin for perhaps 12 to 14 days. Shelly Fowler reports a longer period of recovery with the second episode, primarily a few more weeks of decreased appetite and malaise with decreased energy.  Her general barometer for how well she is feeling is whether or not she can play tennis.  Finally she went back to feeling herself a few weeks ago and has been well since then.  She is still down perhaps 5 pounds from before the most recent diverticulitis episode but does not think that is a problem.  She had some scant rectal bleeding with at least one of the diverticulitis episodes but that  resolved as well.   ROS:  Review of Systems  Constitutional:  Negative for appetite change and unexpected weight change.  HENT:  Negative for mouth sores and voice change.   Eyes:  Negative for pain and redness.  Respiratory:  Negative for cough and shortness of breath.   Cardiovascular:  Negative for chest pain and palpitations.  Genitourinary:  Negative for dysuria and hematuria.  Musculoskeletal:  Negative for arthralgias and myalgias.  Skin:  Negative for pallor and rash.  Neurological:  Negative for weakness and headaches.  Hematological:  Negative for adenopathy.     Past Medical History: Past Medical History:  Diagnosis Date   Abnormal Pap smear of cervix ~2008   f/u normal    Anemia    Thyroid disease      Past Surgical History: Past Surgical History:  Procedure Laterality Date   TONSILLECTOMY     age 47     Family History: Family History  Problem Relation Age of Onset   Breast cancer Mother        has abnormal genetic testing   Diabetes Mother    Cancer Father        Prostate   Diabetes Paternal Grandmother    Colon polyps Neg Hx    Colon cancer Neg Hx    Esophageal cancer Neg Hx    Rectal cancer Neg Hx    Stomach cancer Neg Hx     Social History: Social History   Socioeconomic History   Marital status: Married    Spouse name: Not on file  Number of children: Not on file   Years of education: Not on file   Highest education level: Not on file  Occupational History   Not on file  Tobacco Use   Smoking status: Never   Smokeless tobacco: Never  Vaping Use   Vaping Use: Never used  Substance and Sexual Activity   Alcohol use: Yes    Alcohol/week: 6.0 standard drinks of alcohol    Types: 6 Glasses of wine per week    Comment: socially   Drug use: No   Sexual activity: Not Currently    Birth control/protection: None  Other Topics Concern   Not on file  Social History Narrative   Not on file   Social Determinants of Health    Financial Resource Strain: Not on file  Food Insecurity: Not on file  Transportation Needs: Not on file  Physical Activity: Not on file  Stress: Not on file  Social Connections: Not on file   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     levothyroxine (SYNTHROID, LEVOTHROID) 88 MCG tablet TAKE 1 TABLET BY MOUTH EVERY DAY 30 tablet 2   No current facility-administered medications for this visit.      ___________________________________________________________________ Objective   Exam:  BP 122/68   Pulse 74   Ht 5\' 3"  (1.6 m)   Wt 125 lb (56.7 kg)   SpO2 98%   BMI 22.14 kg/m  Wt Readings from Last 3 Encounters:  06/22/22 125 lb (56.7 kg)  04/23/21 135 lb (61.2 kg)  04/09/21 135 lb (61.2 kg)    General: Well-appearing Eyes: sclera anicteric, no redness ENT: oral mucosa moist without lesions, no cervical or supraclavicular lymphadenopathy CV: Regular without appreciable murmur, no JVD, no peripheral edema Resp: clear to auscultation bilaterally, normal RR and effort noted GI: soft, no tenderness, with active bowel sounds. No guarding or palpable organomegaly noted. Skin; warm and dry, no rash or jaundice noted Neuro: awake, alert and oriented x 3. Normal gross motor function and fluent speech  Labs:  Colonoscopy report by Dr. Adela Lank dated 04/23/2021.  Screening exam, complete exam to cecum, good prep, left-sided diverticulosis with tortuosity, internal hemorrhoids.  No polyps.  Radiologic Studies:  CLINICAL DATA:  LLQ Pain   EXAM: CT ABDOMEN AND PELVIS WITH CONTRAST   TECHNIQUE: Multidetector CT imaging of the abdomen and pelvis was performed using the standard protocol following bolus administration of intravenous contrast.   RADIATION DOSE  REDUCTION: This exam was performed according to the departmental dose-optimization program which includes automated exposure control, adjustment of the mA and/or kV according to patient size and/or use of iterative reconstruction technique.   CONTRAST:  ISOVUE-300 IOPAMIDOL (ISOVUE-300) INJECTION 61%   COMPARISON:  None Available.   FINDINGS: Lower chest: No acute abnormality.   Hepatobiliary: Liver has a normal contour. No focal liver lesions are seen. No evidence of intra or extrahepatic biliary ductal dilatation. Portal veins are contrast opacified. Hepatic veins are not contrast opacified, likely due to the phase of contrast. No evidence of perihepatic fluid. The gallbladder is normal in appearance without evidence of cholelithiasis or cholecystitis.   Pancreas: Unremarkable. No pancreatic ductal dilatation or surrounding inflammatory changes.   Spleen: Normal in size without focal abnormality.   Adrenals/Urinary Tract: Adrenal glands are unremarkable.  Kidneys are normal, without renal calculi, focal lesion, or hydronephrosis. Bladder is unremarkable.   Stomach/Bowel: The stomach, small bowel, large bowel are normal in caliber. No evidence of bowel obstruction. The appendix is not well visualized. There is diverticulosis throughout the large bowel with evidence of diverticulitis at the proximal descending colon (series 2, image 31). There is no evidence of pneumoperitoneum or surrounding abscess.   Vascular/Lymphatic: No significant vascular findings are present. No enlarged abdominal or pelvic lymph nodes.   Reproductive: Uterus and bilateral adnexa are unremarkable.   Other: No abdominal wall hernia or abnormality. No abdominopelvic ascites.   Musculoskeletal: No acute or significant osseous findings. Degenerative changes at L5-S1 with a calcified disc bulge.   IMPRESSION: Acute diverticulitis of the proximal descending colon. No evidence of  pneumoperitoneum or surrounding abscess.   These results will be called to the ordering clinician or representative by the Radiologist Assistant, and communication documented in the PACS or [employer].     Electronically Signed   By: Lorenza Cambridge M.D.   On: 12/02/2021 09:58      ____________________________  CLINICAL DATA:  Left lower quadrant abdominal pain for 1 week. Persistent pain despite antibiotics. Clinical concern for diverticulitis.   EXAM: CT ABDOMEN AND PELVIS WITH CONTRAST   TECHNIQUE: Multidetector CT imaging of the abdomen and pelvis was performed using the standard protocol following bolus administration of intravenous contrast.   RADIATION DOSE REDUCTION: This exam was performed according to the departmental dose-optimization program which includes automated exposure control, adjustment of the mA and/or kV according to patient size and/or use of iterative reconstruction technique.   CONTRAST:  80 cc Isovue 370   COMPARISON:  Abdominopelvic CT 12/02/2021   FINDINGS: Lower chest: Clear lung bases. No significant pleural or pericardial effusion.   Hepatobiliary: The liver is normal in density without suspicious focal abnormality. No evidence of gallstones, gallbladder wall thickening or biliary dilatation.   Pancreas: Unremarkable. No pancreatic ductal dilatation or surrounding inflammatory changes.   Spleen: Normal in size without focal abnormality.   Adrenals/Urinary Tract: Both adrenal glands appear normal. No evidence of urinary tract calculus, suspicious renal lesion or hydronephrosis. The bladder appears unremarkable for its degree of distention.   Stomach/Bowel: Enteric contrast has passed into the distal colon. The stomach appears unremarkable for its degree of distention. No small bowel distension, wall thickening or surrounding inflammation. The appendix is not clearly visualized, although there is no pericecal inflammation to  suggest appendicitis. Scattered diverticula throughout the descending and sigmoid colon with anterior colonic wall thickening at the junction of the descending and sigmoid colon and surrounding inflammatory changes consistent with mild acute diverticulitis (axial images 47 through 52 of series 2). No evidence of bowel obstruction or perforation. Mildly prominent stool in the proximal colon.   Vascular/Lymphatic: There are no enlarged abdominal or pelvic lymph nodes. No significant vascular findings. The portal, superior mesenteric and splenic veins are patent.   Reproductive: The uterus and ovaries appear unremarkable. No adnexal mass.   Other: Intact abdominal wall. No ascites or free air. No focal extraluminal fluid collection.   Musculoskeletal: No acute or significant osseous findings. Chronic degenerative disc disease at L5-S1 with loss of disc height, endplate osteophytes and sclerosis. Mild foraminal narrowing bilaterally.   IMPRESSION: 1. Findings are consistent with recurrent mild acute diverticulitis (now at a different location, at the junction of the descending and sigmoid colon). No evidence of bowel obstruction, perforation or abscess. 2. No other acute findings. 3. Chronic degenerative  disc disease at L5-S1. 4. These results will be called to the ordering clinician or representative by the Radiologist Assistant, and communication documented in the PACS or [employer].     Electronically Signed   By: Carey Bullocks M.D.   On: 05/12/2022 14:24      Assessment: Encounter Diagnosis  Name Primary?   Diverticulitis of colon without hemorrhage Yes    2 episodes of diverticulitis in the last 6 months.  We discussed the unclear nature why these episodes typically occur.  Most dietary triggers have been largely debunked, but she did note having significant intake of popcorn and nuts as lunch or snack, something which she has significantly cut back since  these episodes.  She is now back to feeling herself, there does not appear to be any need for additional medication, dietary changes or other treatments.  She now clearly knows with these episodes feel like and can alert [place] if she seems to be having another, at which time she would most likely be treated with recurrent antibiotics.  She does not presently need to see surgery for consideration of elective resection, but I have told her that if she has ongoing episodes, or if one of them should be complicated with microperforation, abscess or hospital stay then this may need to be reconsidered.    30 minutes were spent on this encounter (including chart review, history/exam, counseling/coordination of care, and documentation) > 50% of that time was spent on counseling and coordination of care.   Charlie Pitter III

## 2022-12-29 ENCOUNTER — Other Ambulatory Visit: Payer: Self-pay | Admitting: Family Medicine

## 2022-12-29 DIAGNOSIS — Z1231 Encounter for screening mammogram for malignant neoplasm of breast: Secondary | ICD-10-CM

## 2023-02-01 ENCOUNTER — Ambulatory Visit
Admission: RE | Admit: 2023-02-01 | Discharge: 2023-02-01 | Disposition: A | Discharge status: Home / Self Care | Payer: BC Managed Care – PPO | Source: Ambulatory Visit | Attending: Family Medicine | Admitting: Family Medicine

## 2023-02-01 DIAGNOSIS — Z1231 Encounter for screening mammogram for malignant neoplasm of breast: Secondary | ICD-10-CM

## 2023-03-13 IMAGING — MR MR BREAST WO/W CM  BILAT
5 series · 30 of 48 positions shown · IV contrast (gadavist)
Comparison: Previous exam(s).

CLINICAL DATA: Abbreviated Breast MRI for breast cancer screening.
Dense fibroglandular pattern. Family history of breast cancer.

LABS:  None obtained at the time of imaging.
EXAM:
BILATERAL ABBREVIATED BREAST MRI WITH AND WITHOUT CONTRAST
TECHNIQUE: Multiplanar, multisequence MR images of both breasts were obtained
prior to and following the intravenous administration of 6 ml of
Gadavist

[Series 2: t2_tirm_tra ipat (a-p) · axial · 3.0mm · 0.66mm/px · z∈[-97,+77]mm · 5 of 59 slices shown]
[im 1/59]
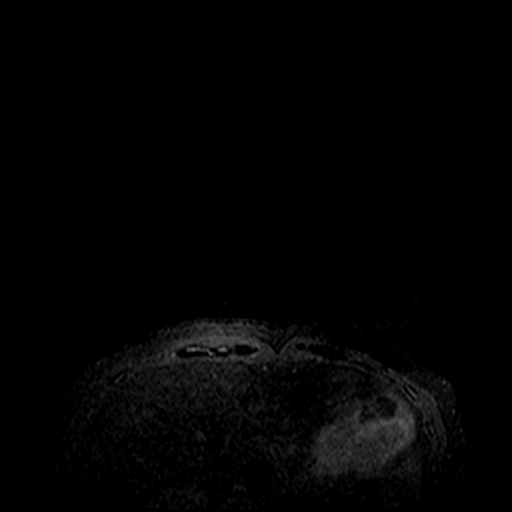
[im 15/59]
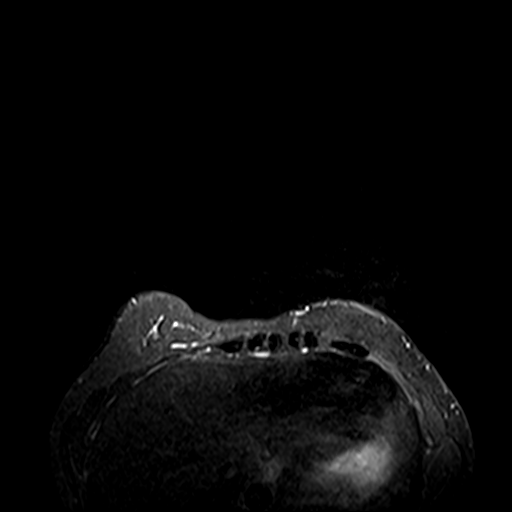
[im 30/59]
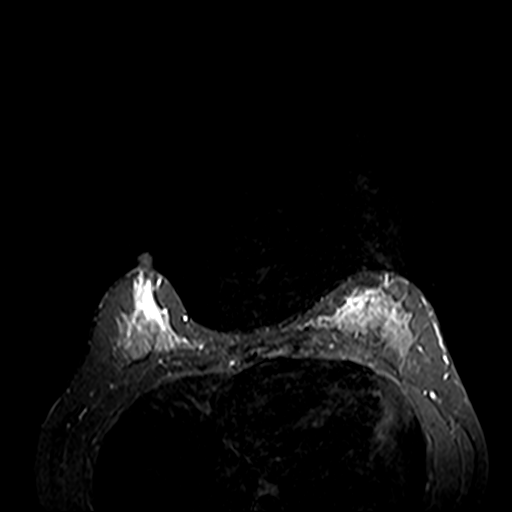
[im 44/59]
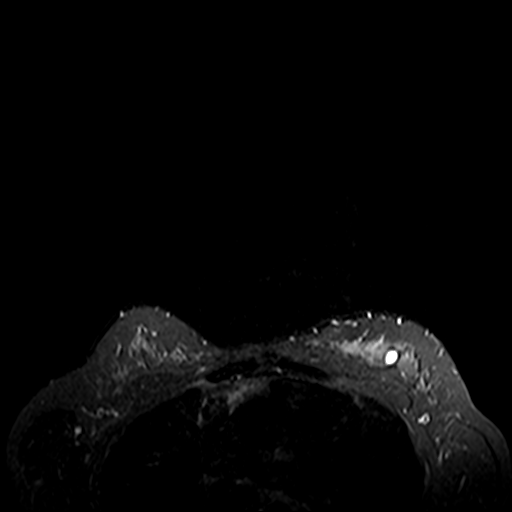
[im 59/59]
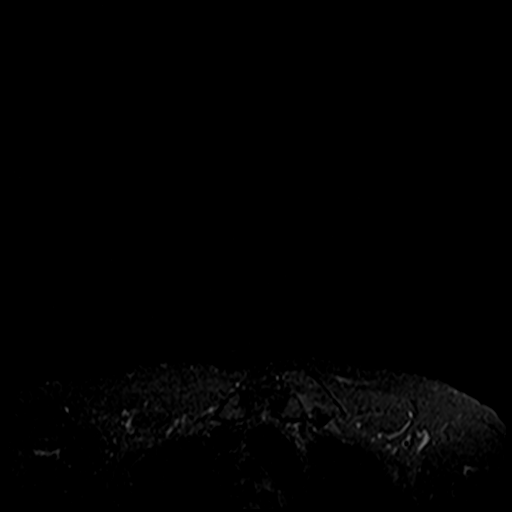

[Series 3: fl3d pre-cm · axial · non-contrast · 1.2mm · 0.89mm/px · z∈[-95,+76]mm · 8 of 144 slices shown]
[im 1/144]
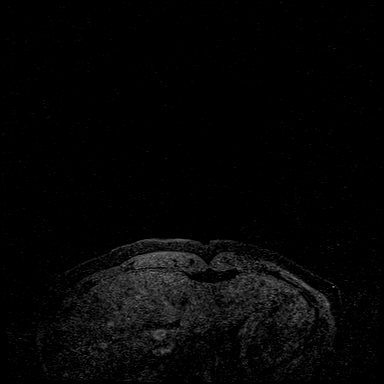
[im 23/144]
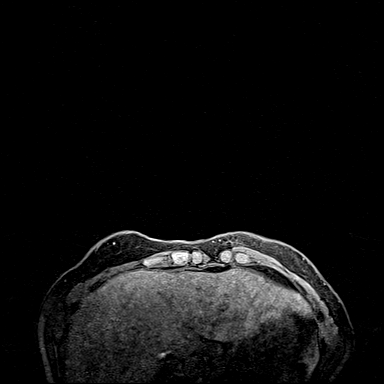
[im 45/144]
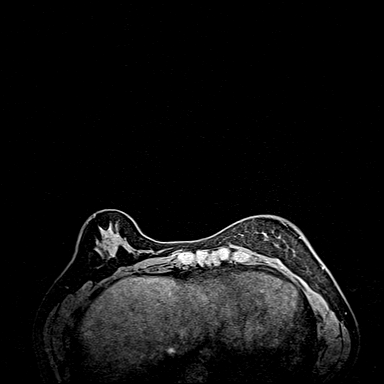
[im 67/144]
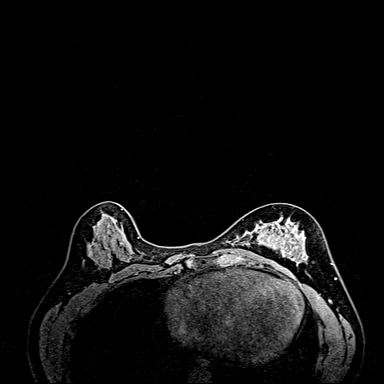
[im 78/144]
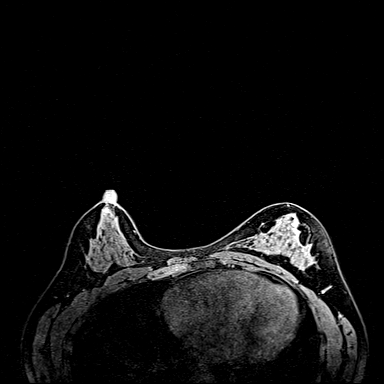
[im 100/144]
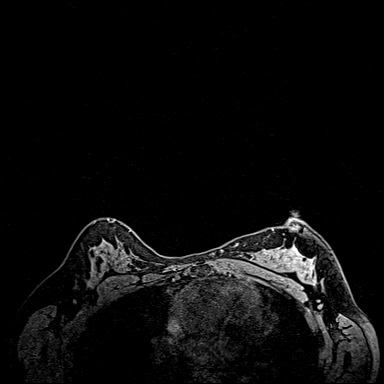
[im 122/144]
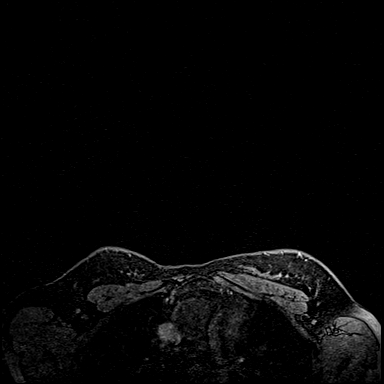
[im 144/144]
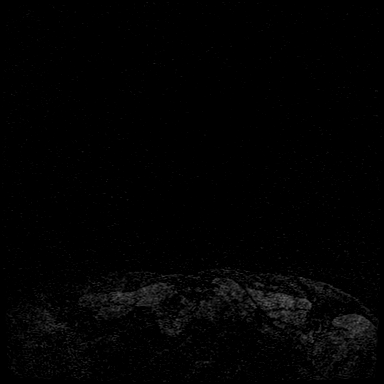

[Series 4: fl3d post immediate · axial · 1.2mm · 0.89mm/px · z∈[-95,+76]mm · 8 of 144 slices shown (1 of 3)]
[im 1/144]
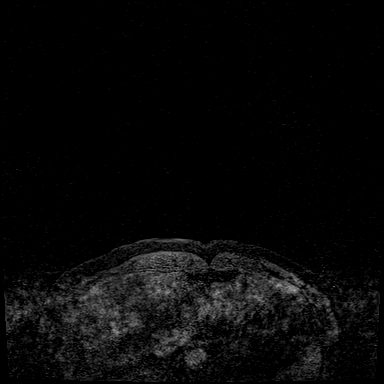
[im 23/144]
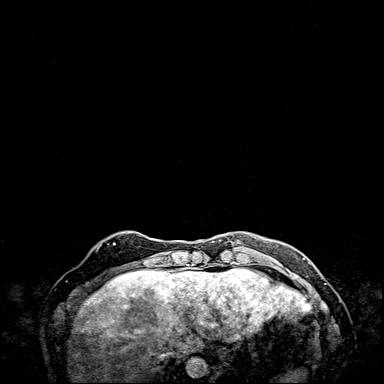
[im 45/144]
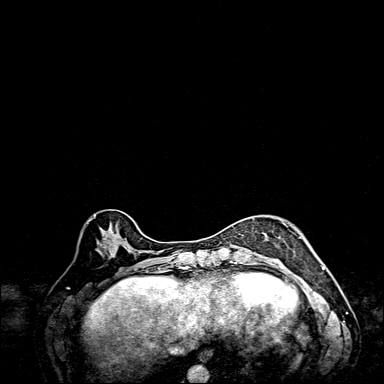
[im 67/144]
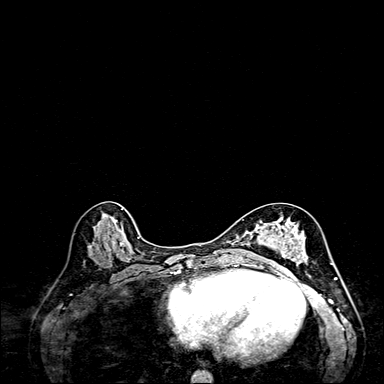
[im 78/144]
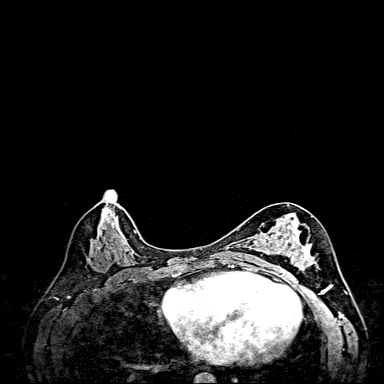
[im 100/144]
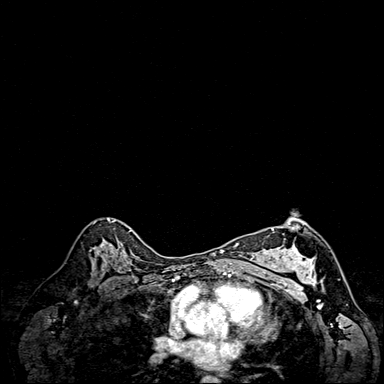
[im 122/144]
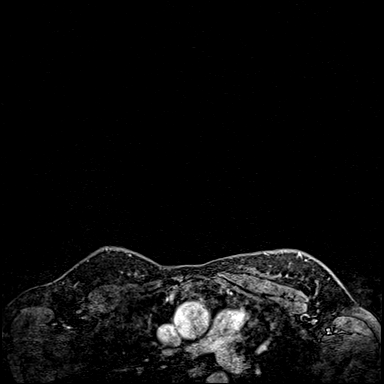
[im 144/144]
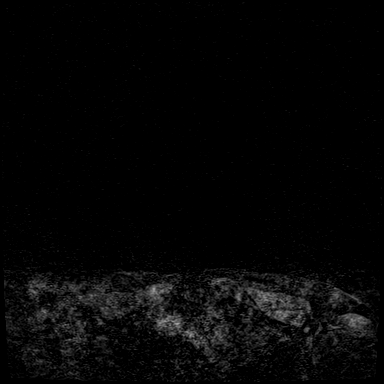

[Series 5: fl3d post immediate · axial · 1.2mm · 0.89mm/px · z∈[-95,+76]mm · 8 of 144 slices shown (2 of 3)]
[im 1/144]
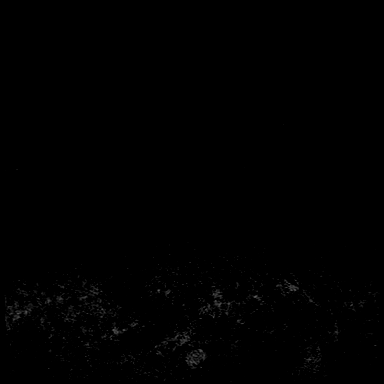
[im 23/144]
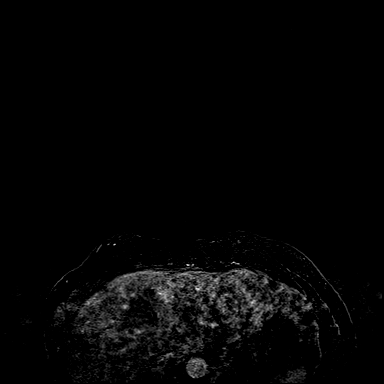
[im 45/144]
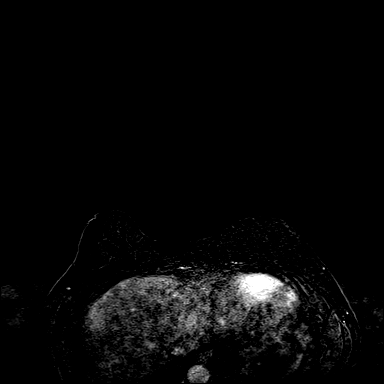
[im 67/144]
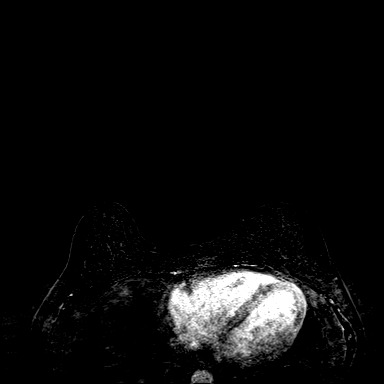
[im 78/144]
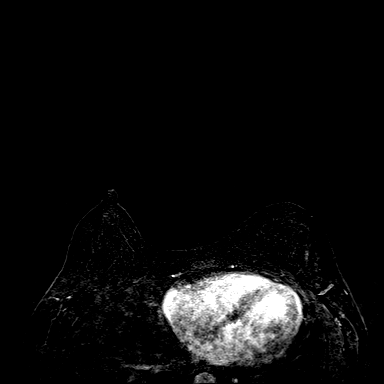
[im 100/144]
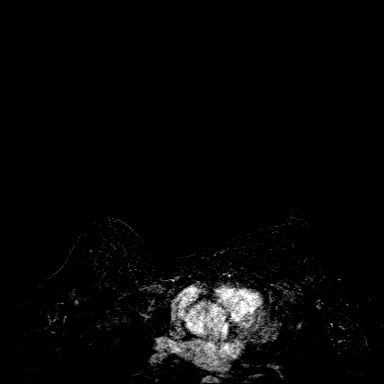
[im 122/144]
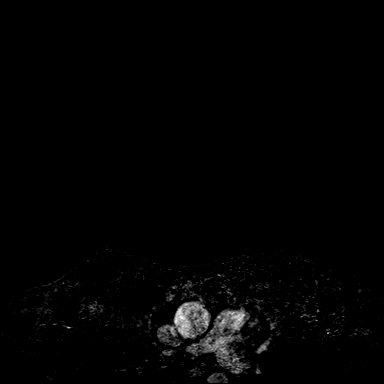
[im 144/144]
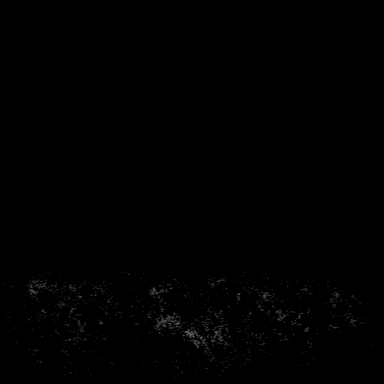

[Series 6: fl3d post immediate · axial · 172.8mm · 0.89mm/px · 1 of 1 slices shown (3 of 3)]
[im 1/1]
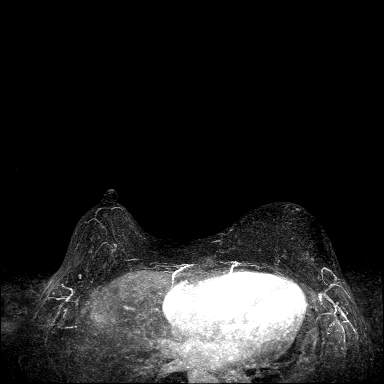

[30 of 48 positions shown; findings below may reference images not displayed]

Three-dimensional MR images were rendered by post-processing of the
original MR data on an independent workstation. The
three-dimensional MR images were interpreted, and findings are
reported in the following complete MRI report for this study. Three
dimensional images were evaluated at the independent [employer]
workstation
FINDINGS: Breast composition: c. Heterogeneous fibroglandular tissue.

Background parenchymal enhancement: Moderate.

Right breast: No mass or abnormal enhancement.

Left breast: No mass or abnormal enhancement.

Lymph nodes: No abnormal appearing lymph nodes.

Ancillary findings:  None.
IMPRESSION: No abnormal enhancement in either breast.

RECOMMENDATION:
Recommend annual screening mammography. The patient is also eligible
for annual Abbreviated Breast MRI if she wishes.

BI-RADS CATEGORY  1: Negative.

## 2023-06-30 ENCOUNTER — Other Ambulatory Visit: Payer: Self-pay | Admitting: Family Medicine

## 2023-06-30 DIAGNOSIS — R9230 Dense breasts, unspecified: Secondary | ICD-10-CM

## 2023-06-30 DIAGNOSIS — Z803 Family history of malignant neoplasm of breast: Secondary | ICD-10-CM

## 2023-07-01 ENCOUNTER — Other Ambulatory Visit (HOSPITAL_BASED_OUTPATIENT_CLINIC_OR_DEPARTMENT_OTHER): Payer: Self-pay | Admitting: Family Medicine

## 2023-07-01 DIAGNOSIS — E782 Mixed hyperlipidemia: Secondary | ICD-10-CM

## 2023-07-01 DIAGNOSIS — Z8739 Personal history of other diseases of the musculoskeletal system and connective tissue: Secondary | ICD-10-CM

## 2023-07-18 ENCOUNTER — Ambulatory Visit (HOSPITAL_BASED_OUTPATIENT_CLINIC_OR_DEPARTMENT_OTHER)
Admission: RE | Admit: 2023-07-18 | Discharge: 2023-07-18 | Disposition: A | Discharge status: Home / Self Care | Source: Ambulatory Visit | Attending: Family Medicine | Admitting: Family Medicine

## 2023-07-18 DIAGNOSIS — E782 Mixed hyperlipidemia: Secondary | ICD-10-CM | POA: Insufficient documentation

## 2023-07-18 DIAGNOSIS — Z8739 Personal history of other diseases of the musculoskeletal system and connective tissue: Secondary | ICD-10-CM

## 2023-07-26 ENCOUNTER — Ambulatory Visit
Admission: RE | Admit: 2023-07-26 | Discharge: 2023-07-26 | Disposition: A | Discharge status: Home / Self Care | Source: Ambulatory Visit | Attending: Family Medicine | Admitting: Family Medicine

## 2023-07-26 DIAGNOSIS — R9230 Dense breasts, unspecified: Secondary | ICD-10-CM

## 2023-07-26 DIAGNOSIS — Z803 Family history of malignant neoplasm of breast: Secondary | ICD-10-CM

## 2023-07-26 MED ORDER — GADOPICLENOL 0.5 MMOL/ML IV SOLN
6.0000 mL | Freq: Once | INTRAVENOUS | Status: AC | PRN
  Administered 2023-07-26: 6 mL via INTRAVENOUS
